# Patient Record
Sex: Male | Born: 1960 | Hispanic: Yes | Marital: Single | Smoking: Never smoker
Health system: Southern US, Community
[De-identification: ages and names within clinical notes are randomized; demographics above are authoritative.]

## PROBLEM LIST (undated history)

## (undated) DIAGNOSIS — I10 Essential (primary) hypertension: Secondary | ICD-10-CM

## (undated) DIAGNOSIS — I2510 Atherosclerotic heart disease of native coronary artery without angina pectoris: Secondary | ICD-10-CM

## (undated) HISTORY — PX: CORONARY ANGIOPLASTY WITH STENT PLACEMENT: SHX49

## (undated) HISTORY — PX: CARDIAC CATHETERIZATION: SHX172

## (undated) HISTORY — DX: Essential (primary) hypertension: I10

## (undated) HISTORY — DX: Atherosclerotic heart disease of native coronary artery without angina pectoris: I25.10

---

## 2022-06-03 IMAGING — MR OMBRO^DIREITO
5 of 7 series · 36 of 40 positions shown · non-contrast
Comparison: none

[Series 3: axial dp fat · axial · right · 3.0mm · 0.78mm/px · z∈[-68,+6]mm · 8 of 24 slices shown]
[im 1/24]
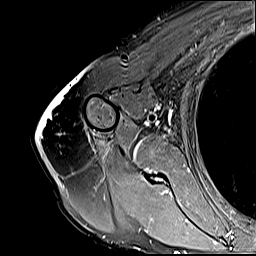
[im 4/24]
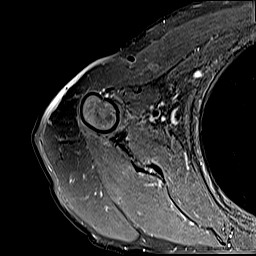
[im 7/24]
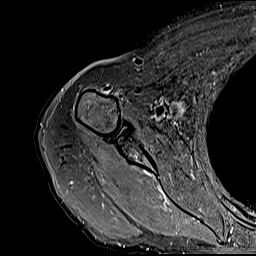
[im 10/24]
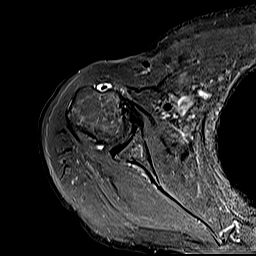
[im 14/24]
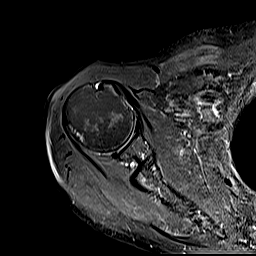
[im 17/24]
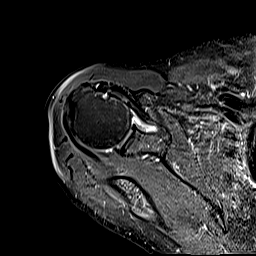
[im 20/24]
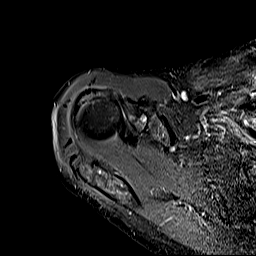
[im 24/24]
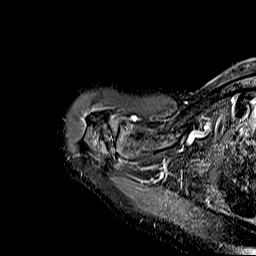

[Series 4: sag dp fat · oblique · right · 3.5mm · 0.78mm/px · 7 of 20 slices shown]
[im 1/20]
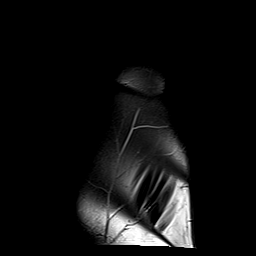
[im 4/20]
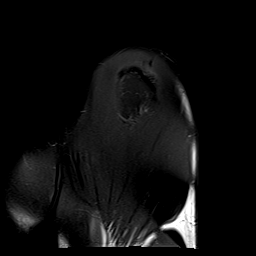
[im 7/20]
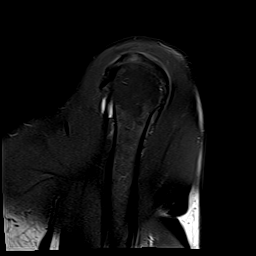
[im 10/20]
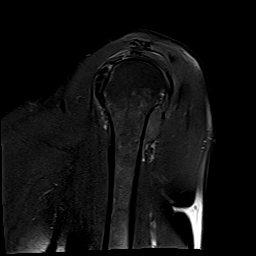
[im 13/20]
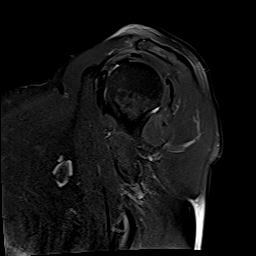
[im 16/20]
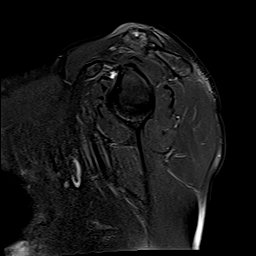
[im 20/20]
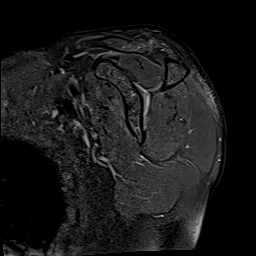

[Series 5: T1 · oblique · right · 3.5mm · 0.78mm/px · 7 of 20 slices shown (1 of 2)]
[im 1/20]
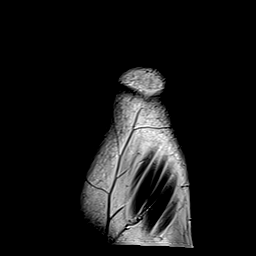
[im 4/20]
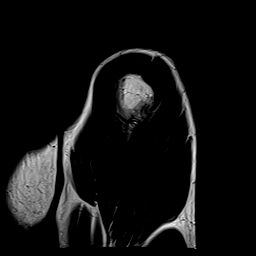
[im 7/20]
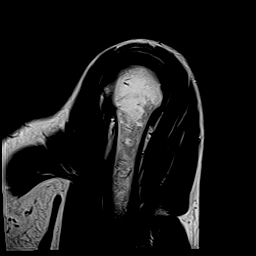
[im 10/20]
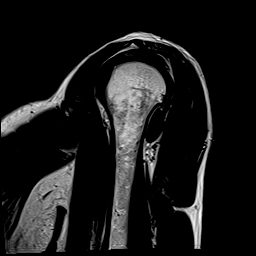
[im 13/20]
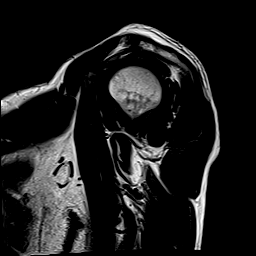
[im 16/20]
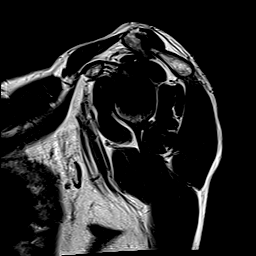
[im 20/20]
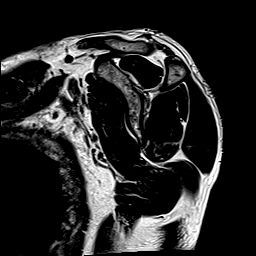

[Series 6: cor dp fat · oblique · right · 3.5mm · 0.78mm/px · 7 of 20 slices shown]
[im 1/20]
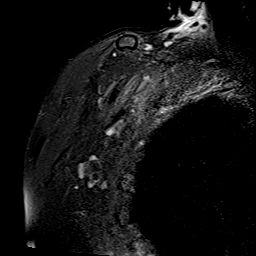
[im 4/20]
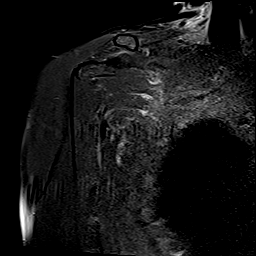
[im 7/20]
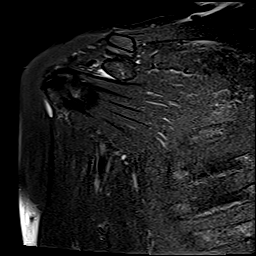
[im 10/20]
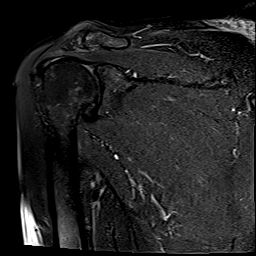
[im 13/20]
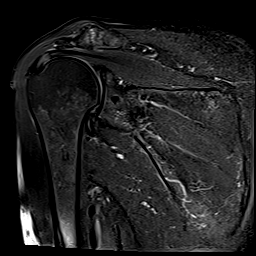
[im 16/20]
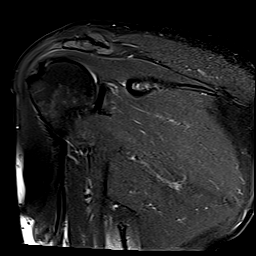
[im 20/20]
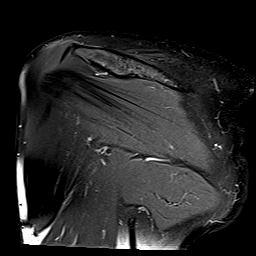

[Series 7: T1 · oblique · right · 3.5mm · 0.78mm/px · 7 of 20 slices shown (2 of 2)]
[im 1/20]
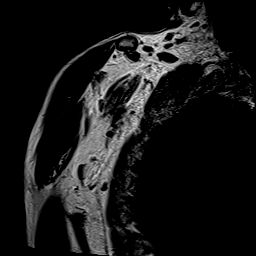
[im 4/20]
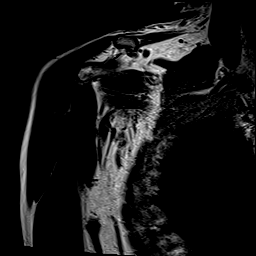
[im 7/20]
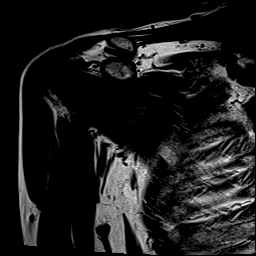
[im 10/20]
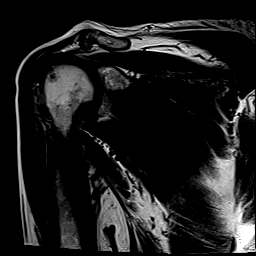
[im 13/20]
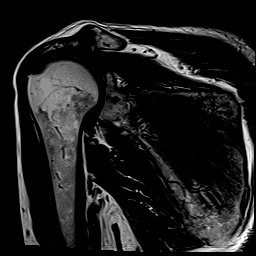
[im 16/20]
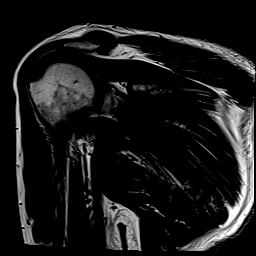
[im 20/20]
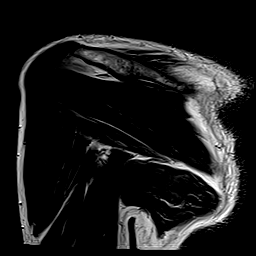

[36 of 40 positions shown; findings below may reference images not displayed]

Ressonância Magnética do Ombro Direito
Técnica:
Exame realizado em equipamento de ressonância magnética com sequências, ponderações e planos
específicos para o segmento de interesse, sem administração endovenosa do meio de contraste.
Descrição:
Tendinopatia do supra e do infraespinhal, bem como do subescapular, sem evidência de roturas.
Tendões redondo menor e cabo longo do bíceps sem anormalidades.
Ventres musculares tróficos.
Artropatia degenerativa acromioclavicular, com redução da interlinha articular e osteófitos marginais.
Acrômio curvilíneo, com inclinação lateral inferior, associada a espessamento do ligamento
coracoacromial junto à sua entese subacromial, reduzindo o túnel miotendíneo do supraespinhal.
Demais estruturas ósseas com morfologia e intensidade de sinal normais.
Discreta bursite subacromial-subdeltoidea.
Superfície condral glenoumeral preservada.
Lábio glenoidal sem alterações.
Ausência de derrame articular.
Impressão diagnóstica:
- Tendinopatia do supra e do infraespinhal, bem como do subescapular, sem evidência de roturas.
- Discreta bursite subacromial-subdeltoidea.
- Artropatia degenerativa acromioclavicular.
- Inclinação lateral inferior do acrômio, associada a espessamento do ligamento coracoacromial junto à
sua entese subacromial, reduzindo o túnel miotendíneo do supraespinhal.

## 2022-06-03 IMAGING — MR OMBRO^ESQUERDO
5 of 7 series · 36 of 40 positions shown · non-contrast
Comparison: none

[Series 3: axial dp fat · axial · left · 3.5mm · 0.78mm/px · z∈[-89,-1]mm · 8 of 24 slices shown]
[im 1/24]
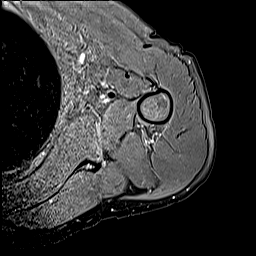
[im 4/24]
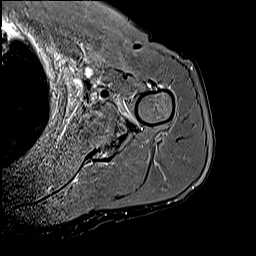
[im 7/24]
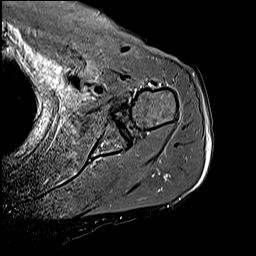
[im 10/24]
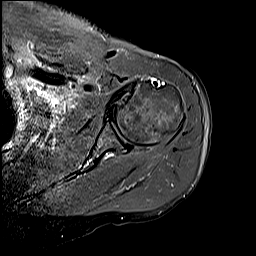
[im 14/24]
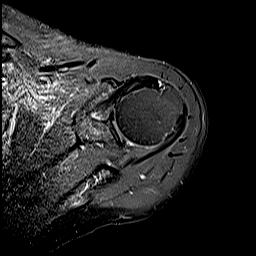
[im 17/24]
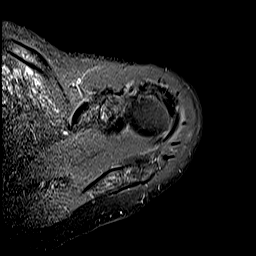
[im 20/24]
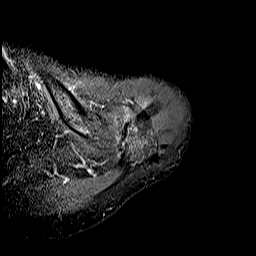
[im 24/24]
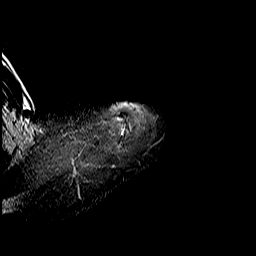

[Series 4: sag dp fat · oblique · left · 3.5mm · 0.78mm/px · 7 of 20 slices shown]
[im 1/20]
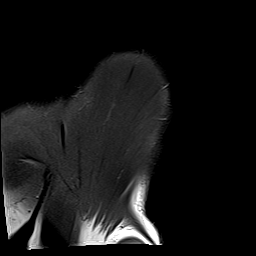
[im 4/20]
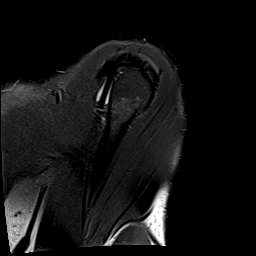
[im 7/20]
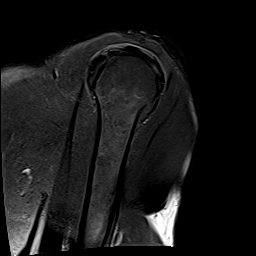
[im 10/20]
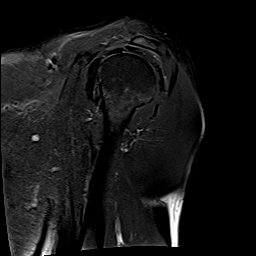
[im 13/20]
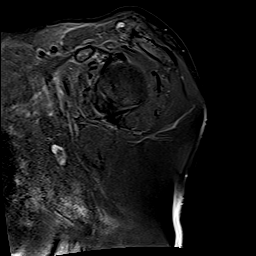
[im 16/20]
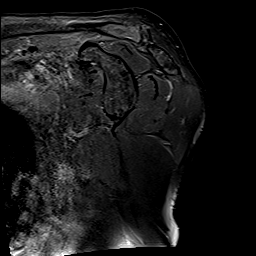
[im 20/20]
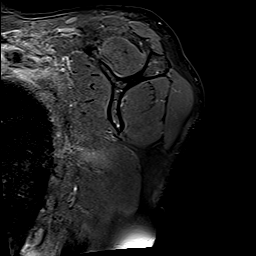

[Series 5: T1 · oblique · left · 3.5mm · 0.78mm/px · 7 of 20 slices shown (1 of 2)]
[im 1/20]
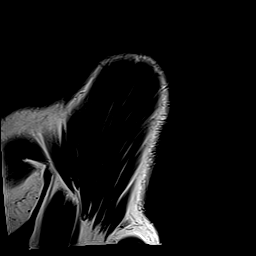
[im 4/20]
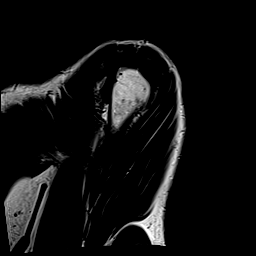
[im 7/20]
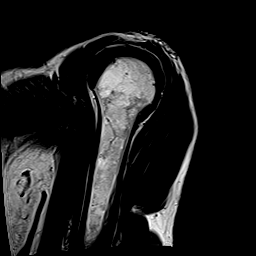
[im 10/20]
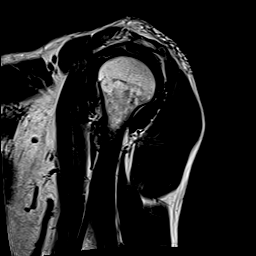
[im 13/20]
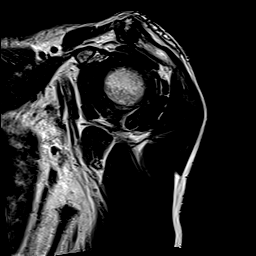
[im 16/20]
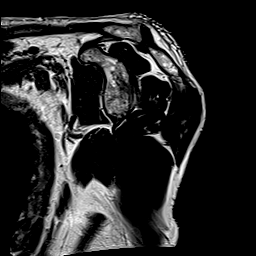
[im 20/20]
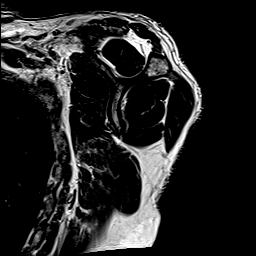

[Series 6: cor dp fat · oblique · left · 3.5mm · 0.78mm/px · 7 of 20 slices shown]
[im 1/20]
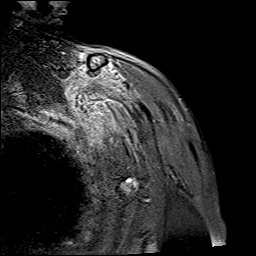
[im 4/20]
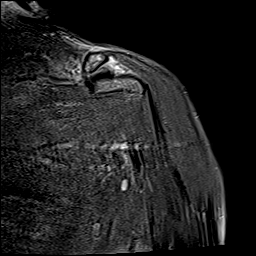
[im 7/20]
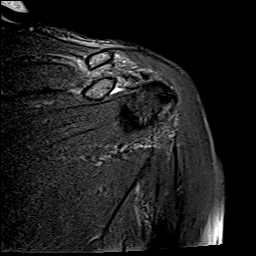
[im 10/20]
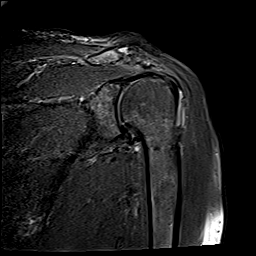
[im 13/20]
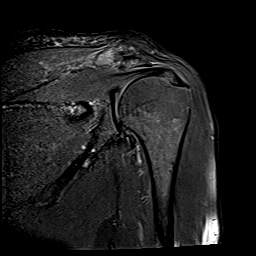
[im 16/20]
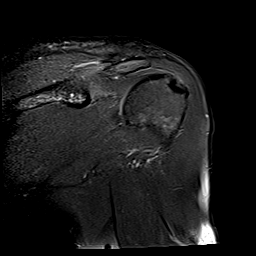
[im 20/20]
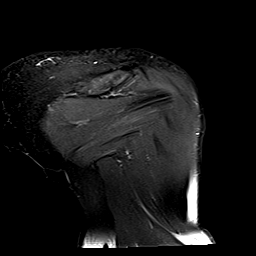

[Series 7: T1 · oblique · left · 3.5mm · 0.78mm/px · 7 of 20 slices shown (2 of 2)]
[im 1/20]
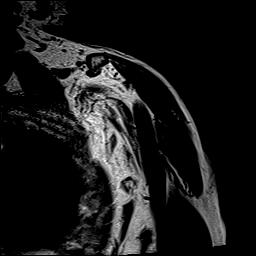
[im 4/20]
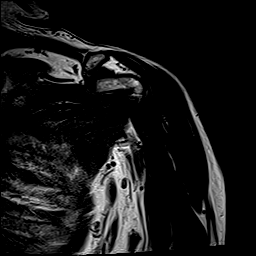
[im 7/20]
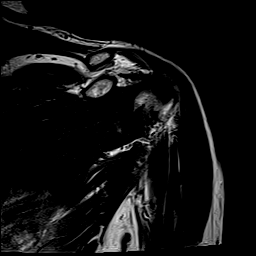
[im 10/20]
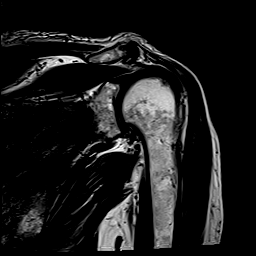
[im 13/20]
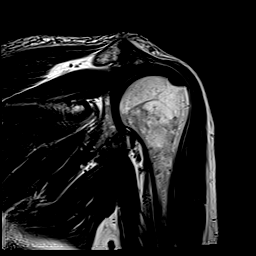
[im 16/20]
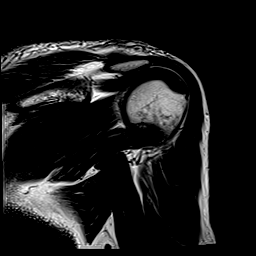
[im 20/20]
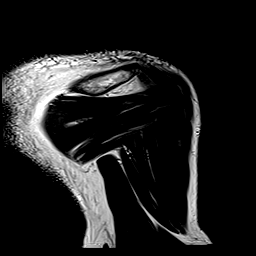

[36 of 40 positions shown; findings below may reference images not displayed]

Ressonância Magnética do Ombro Esquerdo
Técnica:
Exame realizado em equipamento de ressonância magnética com sequências, ponderações e planos
específicos para o segmento de interesse, sem administração endovenosa do meio de contraste.
Descrição:
Tendinopatia do supra e do infraespinhal, bem como do subescapular, sem evidência de roturas.
Tendões redondo menor e cabo longo do bíceps sem anormalidades.
Ventres musculares tróficos.
Artropatia degenerativa acromioclavicular, com redução da interlinha articular e osteófitos marginais.
Acrômio curvilíneo, com inclinação lateral neutra.
Demais estruturas ósseas com morfologia e intensidade de sinal normais.
Bursa subacromial-subdeltoidea de espessura habitual.
Superfície condral glenoumeral preservada.
Lábio glenoidal sem alterações.
Ausência de derrame articular.
Impressão diagnóstica:
- Tendinopatia do supra e do infraespinhal, bem como do subescapular, sem evidência de roturas.
- Artropatia degenerativa acromioclavicular, com redução da interlinha articular e osteófitos marginais.

## 2022-06-03 IMAGING — MR COLUNA^LOMBAR
4 of 7 series · 19 of 48 positions shown · non-contrast
Comparison: none

[Series 2: sagital_t2_quiet · sagittal · 4.0mm · 0.55mm/px · 6 of 12 slices shown]
[im 1/12]
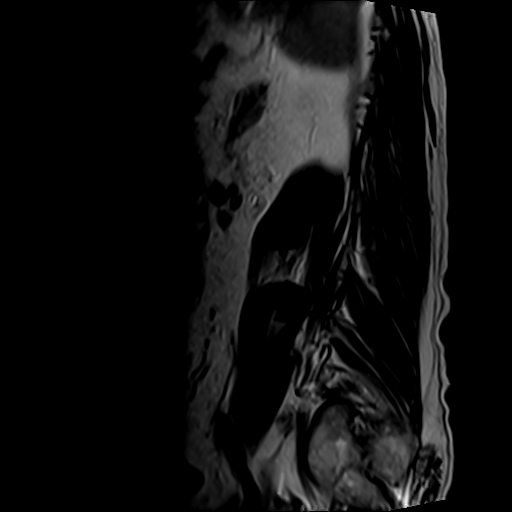
[im 3/12]
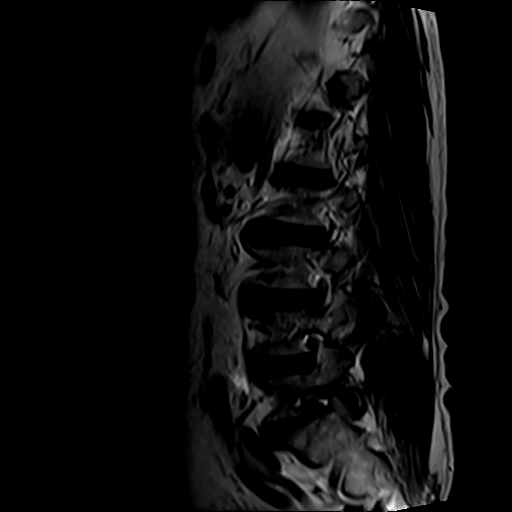
[im 5/12]
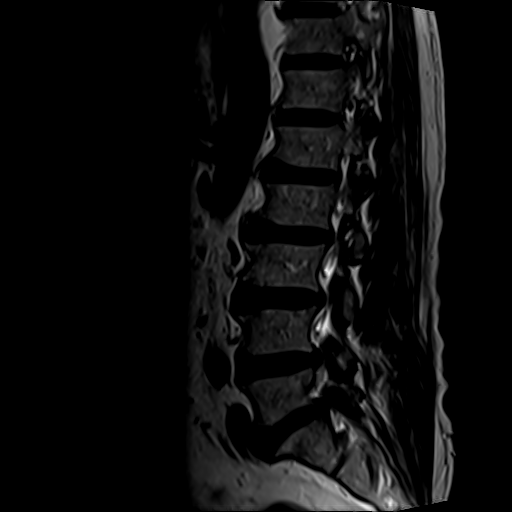
[im 7/12]
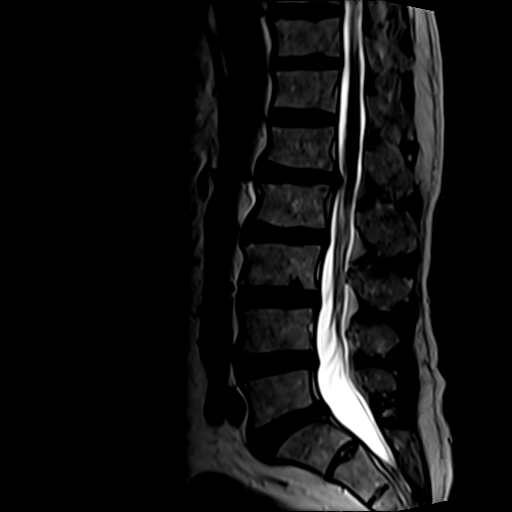
[im 9/12]
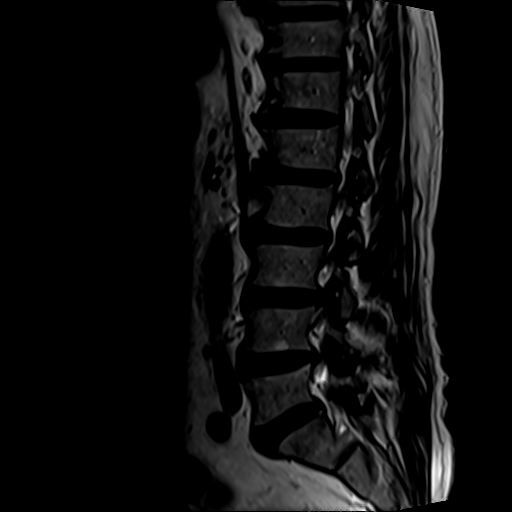
[im 12/12]
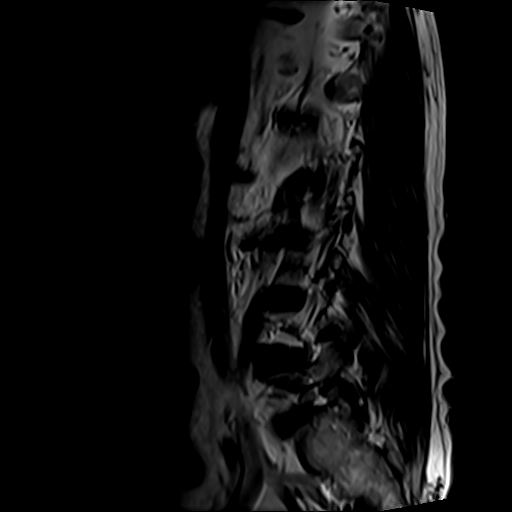

[Series 3: sagital_t1_quiet · sagittal · 4.0mm · 0.55mm/px · 5 of 12 slices shown]
[im 1/12]
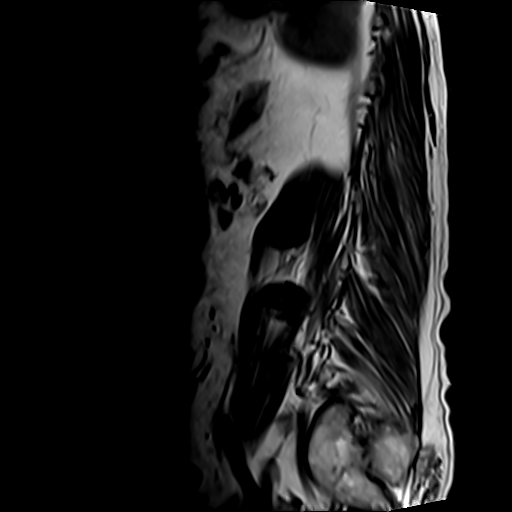
[im 3/12]
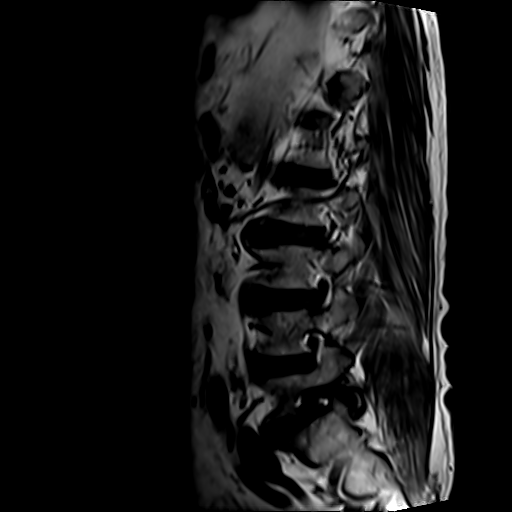
[im 6/12]
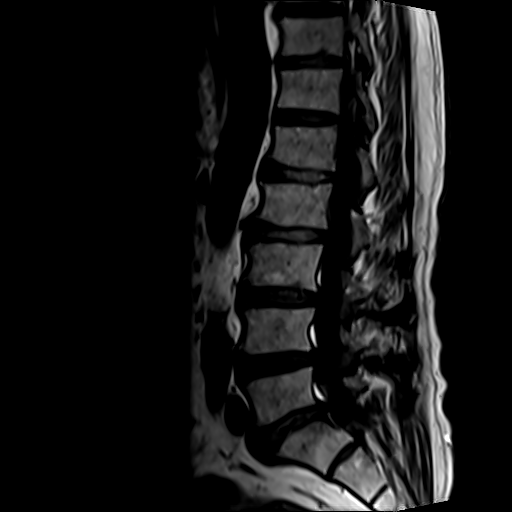
[im 9/12]
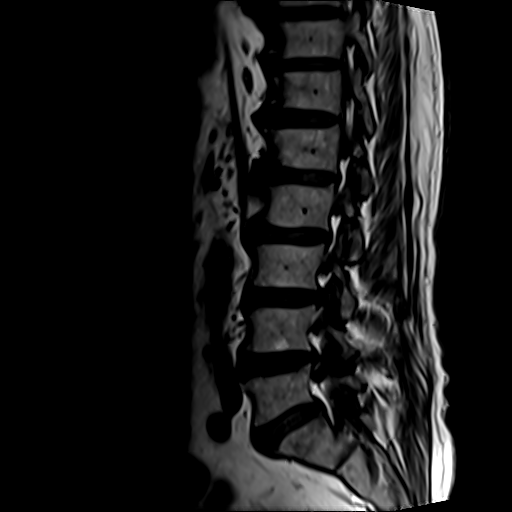
[im 12/12]
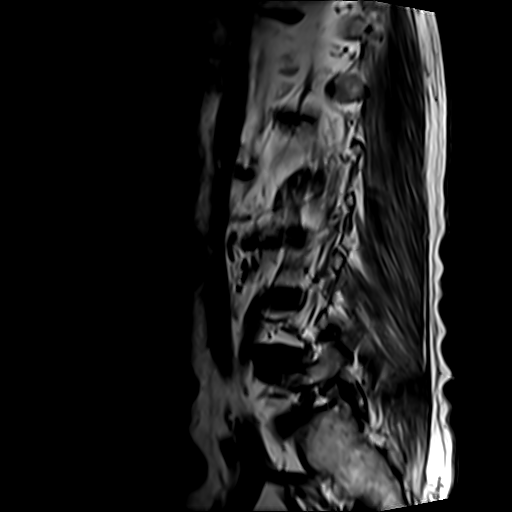

[Series 4: axial_t2_quiet · axial · 4.0mm · 0.43mm/px · z∈[-28,+45]mm · 5 of 20 slices shown (1 of 2)]
[im 1/20]
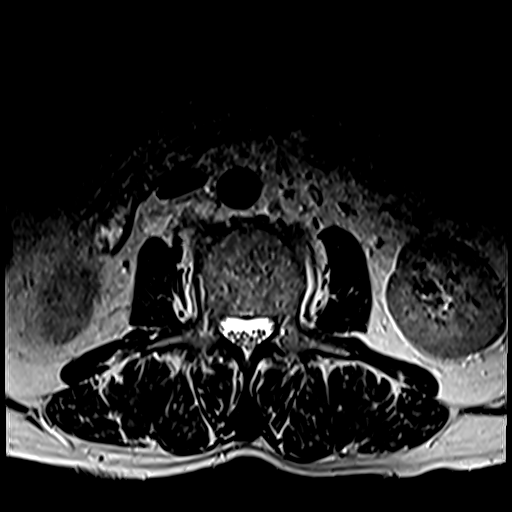
[im 3/20]
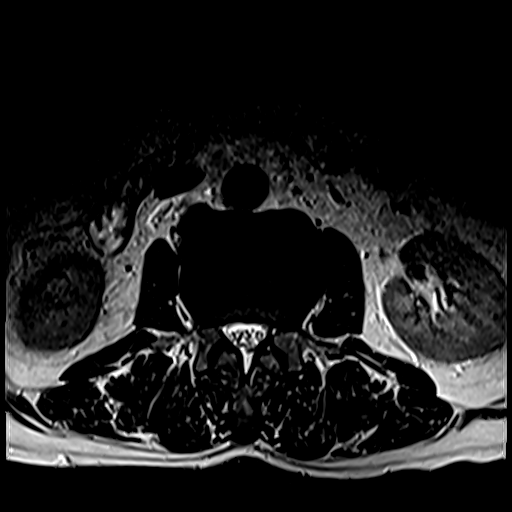
[im 5/20]
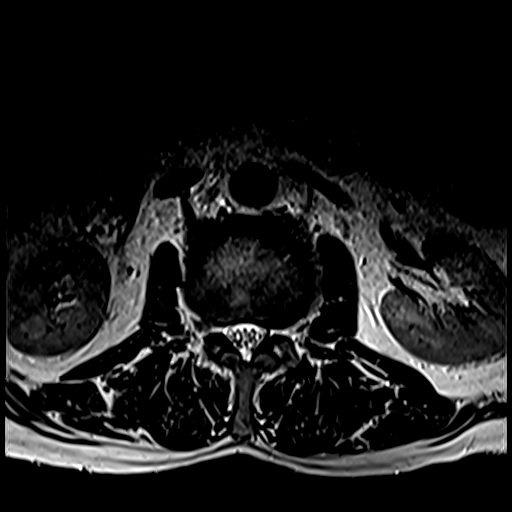
[im 10/20]
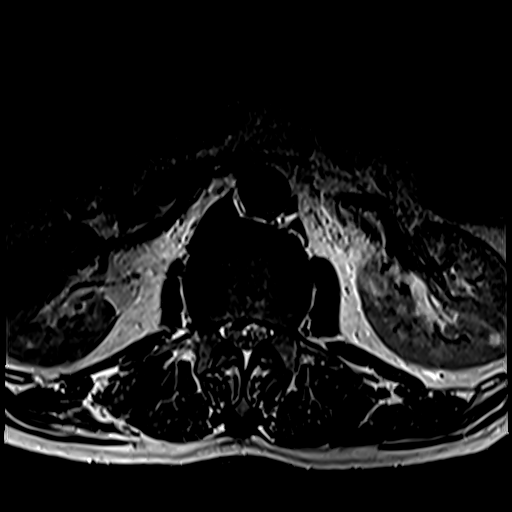
[im 17/20]
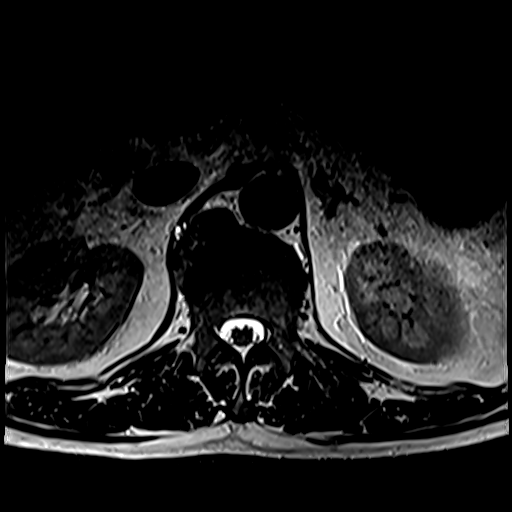

[Series 5: axial_t2_quiet · axial · 4.0mm · 0.43mm/px · z∈[-161,-72]mm · 3 of 24 slices shown (2 of 2)]
[im 3/24]
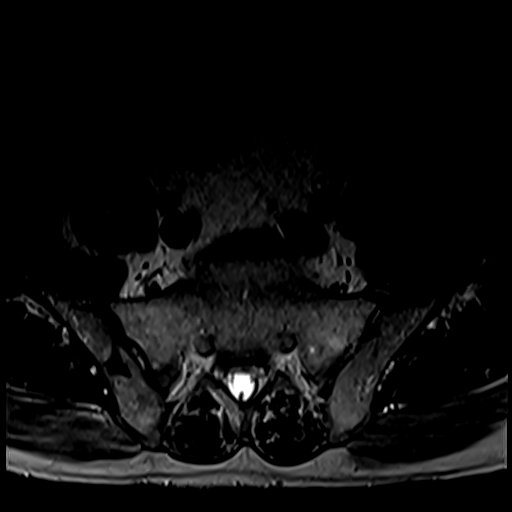
[im 12/24]
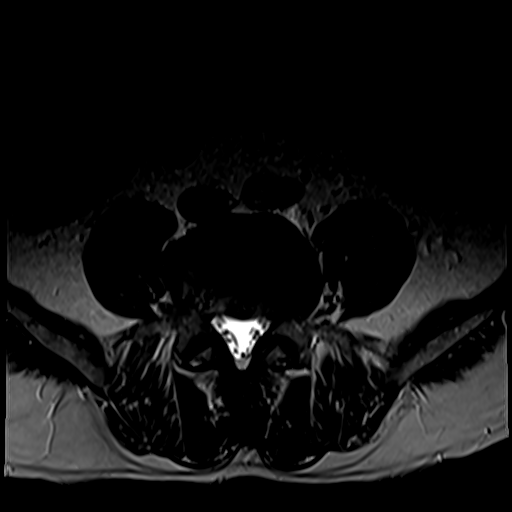
[im 21/24]
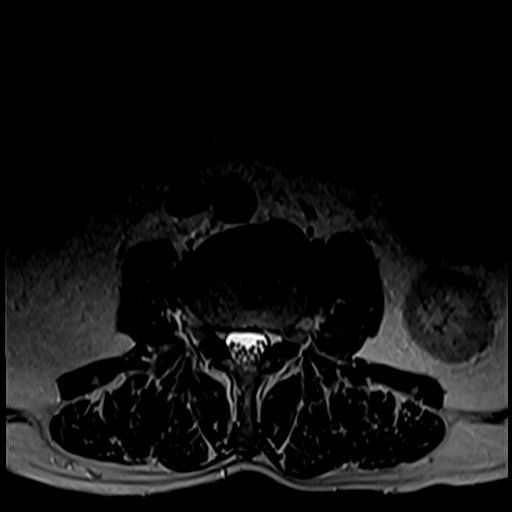

[19 of 48 positions shown; findings below may reference images not displayed]

RESSONÂNCIA MAGNÉTICA DA COLUNA LOMBAR
Técnica:
Exame realizado com imagens obtidas predominantemente em T1 e T2, em aquisições multiplanares.
Análise:
Leve retrolistese de L4-L5.
Corpos vertebrais de altura e sinal da medular óssea conservados. Reações osteofitárias marginais nos
corpos vertebrais. 
Nódulo de Schmörl no platô inferior de L3.
Redução da altura discal em L1-L2 e L2-L3.
Hipertrofia das articulações interapofisárias, mais evidente em L4-L5 e L5-S1.
Protrusão discal posterior central em L1-L2, comprimindo a face anterior do saco dural.
Abaulamento discal difuso em L2-L3, retificando a face anterior do saco dural e estendendo-se às bases
foraminais determinando leve redução da amplitude.
Abaulamento discal difuso em L3-L4 e L4-L5, retificando a face anterior do saco dural e estendendo-se
às bases foraminais, determinando moderada redução da amplitude e tocando as raízes emergentes no
segmento extra canal.
Protrusão discal posterior central em L5-S1, obliterando a gordura epidural anterior e tocando o saco
dural.
Diâmetros normais do canal vertebral e demais forames de conjugação.
Cone medular de forma e sinal normais na transição tóraco-lombar.
Raízes da cauda equina de distribuição anatômica no interior do saco dural.
Musculatura paravertebral sem alterações.
Edema dos ligamentos interespinhosos em L3-L4 e L5-S1 denotando sobrecarga mecânica.

## 2022-09-22 ENCOUNTER — Ambulatory Visit (INDEPENDENT_AMBULATORY_CARE_PROVIDER_SITE_OTHER): Payer: HMO | Admitting: Cardiology

## 2022-09-22 ENCOUNTER — Encounter (INDEPENDENT_AMBULATORY_CARE_PROVIDER_SITE_OTHER): Payer: Self-pay | Admitting: Cardiology

## 2022-09-22 VITALS — BP 104/78 | HR 64 | Ht 72.0 in | Wt 192.0 lb

## 2022-09-22 DIAGNOSIS — R0989 Other specified symptoms and signs involving the circulatory and respiratory systems: Secondary | ICD-10-CM

## 2022-09-22 DIAGNOSIS — E785 Hyperlipidemia, unspecified: Secondary | ICD-10-CM

## 2022-09-22 DIAGNOSIS — I214 Non-ST elevation (NSTEMI) myocardial infarction: Secondary | ICD-10-CM

## 2022-09-22 DIAGNOSIS — I2510 Atherosclerotic heart disease of native coronary artery without angina pectoris: Secondary | ICD-10-CM

## 2022-09-22 DIAGNOSIS — I10 Essential (primary) hypertension: Secondary | ICD-10-CM

## 2022-09-22 NOTE — Progress Notes (Signed)
[facility] OFFICE CONSULTATION NOTE    [facility] [place] [facility] OFFICE - [place] [id number]  [place] [address]  Dept: [phone]  Dept Fax: [phone]         Patient Name: Alexander Russell    Date of Visit:  September 22, 2022  Date of Birth: 1961/06/07  AGE: 62 y.o.  Medical Record #: [id number]  Requesting Physician: No primary care provider on file.      CHIEF COMPLAINT:  Coronary artery disease    HISTORY OF PRESENT ILLNESS    Alexander Russell is being seen today for cardiovascular evaluation at the request of No primary care provider on file.Marland Kitchen He is a pleasant 62 y.o. male who presents for coronary artery disease.    Patient was hospitalized at [facility] last week for NSTEMI.  He presented with chest pain and T wave abnormalities, and ultimately underwent LHC, which revealed a large thrombotic subtotal occlusion of the proximal ectatic RCA, for which he underwent complicated intervention with thrombectomy and DES.  His echocardiogram was largely unremarkable, and he was discharged uneventfully on 1/27.    Since discharge, patient has been feeling well.  He has not had any discomfort in his chest, nor shortness of breath.  He denied numbness or tingling in his right hand.  He has had some bilateral upper extremity aching that is nonexertional.  He has also had lightheadedness, particularly when he first gets out of bed in the morning.  This resulted in a fall once, without syncope.  Blood pressure today in the office is 104/78.      PAST MEDICAL HISTORY: He has a past medical history of Coronary artery disease and Hypertension. He has a past surgical history that includes Cardiac catheterization and Coronary angioplasty with stent.    ALLERGIES: No Known Allergies    MEDICATIONS:   Patient's current medications were reviewed. ONLY Cardiac medications were updated unless others were addressed in assessment and plan.    Current Outpatient  Medications:     Aspirin Low Dose 81 MG EC tablet, Take 1 tablet (81 mg) by mouth every morning, Disp: , Rfl:     atorvastatin (LIPITOR) 40 MG tablet, Take 1 tablet (40 mg) by mouth daily, Disp: , Rfl:     clopidogrel (PLAVIX) 75 mg tablet, Take 1 tablet (75 mg) by mouth every morning, Disp: , Rfl:     metoprolol tartrate (LOPRESSOR) 25 MG tablet, Take 1 tablet (25 mg) by mouth 2 (two) times daily, Disp: , Rfl:      FAMILY HISTORY: family history includes Hypertension in his brother.    SOCIAL HISTORY: He reports that he has never smoked. He has never used smokeless tobacco. He reports that he does not drink alcohol and does not use drugs.    REVIEW OF SYSTEMS:   No data recorded       PHYSICAL EXAMINATION    Visit Vitals  BP 104/78 (BP Site: Left arm, Patient Position: Sitting, Cuff Size: Medium)   Pulse 64   Ht 1.829 m (6')   Wt 87.1 kg (192 lb)   BMI 26.04 kg/m        General Appearance:  Well appearing, no acute distress  Skin: Warm and dry  Head: Normocephalic, atraumatic  Eyes: Conjunctivae and lids unremarkable.  Neck: No carotid bruit  Chest: Clear to auscultation bilaterally with good air movement and respiratory effort and no wheezes, rales, or rhonchi  Cardiovascular: Regular rhythm, S1 normal, S2 normal, No S3 or S4.  No murmur. No gallops or rubs detected   Extremities: Warm without edema. 1+ right radial pulse, 2+ left radial pulse  Neuro: No gross motor or sensory deficits noted.      ECG (09/15/22): Sinus arrhythmia, LAD      LABS:   No results found for: "WBC", "HGB", "HCT", "PLT"  No results found for: "GLU", "BUN", "CREAT", "NA", "K", "CL", "CO2", "AST", "ALT"  No results found for: "MG", "TSH", "HGBA1C", "BNP"  No results found for: "CHOL", "TRIG", "HDL", "LDL"      IMPRESSION:   Alexander Russell is a 62 y.o. male with the following problems:    Coronary artery disease  NSTEMI 09/15/22 s/p thrombectomy/DES to thrombotic proximal ectatic RCA.  Minimal residual disease.  Orthostasis  Diminished right  radial pulse  Hypertension  Hyperlipidemia.  LDL 100 mg/dL Jan 2024.  Echo Jan 2024: LVEF 60-65%, mild LVH, G1DD, mild MAC, trace MR      RECOMMENDATIONS:    Continue aspirin indefinitely, clopidogrel through at least January 2025.  Continue statin therapy.  Recheck lipids and check lipoprotein a in 3 months.  Target LDL less than 70 mg/dL.  Continue metoprolol.  Will discontinue lisinopril in light of orthostasis.  Right upper extremity ultrasound for diminished right radial pulse.  Hand is otherwise neurovascularly intact.  Patient will contact the office if he has ongoing bilateral upper extremity discomfort over next several weeks, which could reflect myalgias from statin therapy necessitating decrease in dose.  Cardiac rehab referral  APP follow-up 4 months, MD 1 year.                                                 Orders Placed This Encounter   Procedures    Upper Extremity Arterial Duplex Limited(Right)    Lipid panel    Lipoprotein A (LPA)    Referral to Cardiac Rehabilitation    APP Office Visit (HRT )       No orders of the defined types were placed in this encounter.        SIGNED:    Edman Circle, MD         This note was generated by the Dragon speech recognition and may contain errors or omissions not intended by the user. Grammatical errors, random word insertions, deletions, pronoun errors, and incomplete sentences are occasional consequences of this technology due to software limitations. Not all errors are caught or corrected. If there are questions or concerns about the content of this note or information contained within the body of this dictation, they should be addressed directly with the author for clarification.

## 2022-09-24 ENCOUNTER — Encounter (INDEPENDENT_AMBULATORY_CARE_PROVIDER_SITE_OTHER): Payer: Self-pay

## 2022-10-11 ENCOUNTER — Telehealth (INDEPENDENT_AMBULATORY_CARE_PROVIDER_SITE_OTHER): Payer: Self-pay

## 2022-10-11 NOTE — Telephone Encounter (Signed)
Assisted by [facility] # [phone]     Patient was referred for cardiac rehab by Dr Melina Copa.  Patient states he was told he needs to make an appt.  He does not understand what this is for.  Explained the process of setting up cardiac rehab and that the 1st visit is an intake appt.  Gave him the phone number for [facility] rehab department.  No other questions.

## 2022-10-27 ENCOUNTER — Other Ambulatory Visit (INDEPENDENT_AMBULATORY_CARE_PROVIDER_SITE_OTHER): Payer: HMO

## 2022-11-01 DIAGNOSIS — I214 Non-ST elevation (NSTEMI) myocardial infarction: Secondary | ICD-10-CM | POA: Insufficient documentation

## 2022-11-01 NOTE — Initial Cardiac Rehab ITP (Signed)
Cardiac Rehab Individual Treatment Plan    Referring Provider: Edman Circle, MD   Comments: Initial Assessment    Assessment Period  Phase: Initial Assessment  Program : Currently enrolled in Phase 2  Session #: 1  First Exercise Session (Date): 11/02/22  Referring Diagnosis: NSTEMI, PCI, Stent (stent RCA)  Date of Event: 09/15/22  Additional Cardiac History: None  AACVPR Risk Stratification: Low risk  Ejection Fraction: 60-65%  Ejection Fraction (Test): Echo  Ejection Fraction Study Date: 08/2022    Exercise  Assessment: No exercise history     Exercise Prescription/Plan  40% Heart Rate Reserve: 118  80% Heart Rate Reserve: 145  Frequency: Cardiac Rehab 3 days/week, Home 2 days/week  RPE Range: 11-14  METS: 2-4  Time: 20-29 minutes  Mode: Treadmill, Bike, Walking  Strength Training: Begin when cleared by CR clinician  Education/Intervention: Educate on RPE, THR, warm up/cool down, Progress time and intensity when a steady state of HR and RPE occur, Educate on signs/symptoms of cardiovascular compromise  Goals: Increase in METS by at least 40%, Cardiovascular exercise 150-300 minutes per week, Avoid inactivity  Goal(s) Status: Not applicable on initial assessment     Nutrition  Assessment: Pending  Height: 182.9 cm (6')  Initial Weight (lbs): 192lb  Weight (lbs): 192lb  BMI (calculated): 26  BMI Classification: Overweight BMI 25-29.9     Nutrition Plan  Current Eating Plan: Unspecified nutrition plan  Food Log: Provided  Hydration: Water  Fluid Restriction: No fluid restriction  Consult Status: Consult not yet scheduled  CRP Education/Intervention: Reinforce RDN recommendations, Educate on the health benefits of weight management, Educate patient on heart healthy food choices, Hydration Guidelines  Goals: BMI 18.5-24.9, Waist (men) <40 inches, (women) <35 inches, Heart healthy eating, Decrease body weight  Goal(s) Status: Not applicable on initial assessment     Psychosocial  Assessment: Pending assessment  Are  you married, widowed, divorced, separated, never married, or living with a partner?:  (single)  Barriers to Learning: Language (needs interpreter)  Initial PHQ-9 Score:  (pending)  Initial COOP Quality of Life Survey Score: pending  Education/Intervention: Regular physical activity/exercise, Assist patient with identifying stressors, Educate on positive coping mechanisms  Goals: Adequate support system, Positive coping mechanisms  Goal(s) Status: Not applicable on initial assessment     Blood Pressure  Assessment: No Dx HTN  Resting BP Range: 104/78 (OV)  Education/Intervention: Obtain home BP monitor, Recommend home BP checks, Educate/Reinforce benefits of regular exercise on BP  Goals: BP<120/80 or within MD recommended guidelines, Medication compliance, Monitor BP at home  Goal(s) Status: Not applicable on initial assessment     Heart Failure:  Assessment: No Heart Failure    SET-PAD:  Assessment: No SET PAD    Patient Stated Goals  What Matters Most?: pending (11/01/2022 10:00 AM)      Patient Stated Goal 1: pending

## 2022-11-02 ENCOUNTER — Inpatient Hospital Stay
Admission: RE | Admit: 2022-11-02 | Discharge: 2022-11-02 | Disposition: A | Discharge status: Home / Self Care | Payer: HMO | Source: Ambulatory Visit | Attending: Cardiology | Admitting: Cardiology

## 2022-11-02 ENCOUNTER — Other Ambulatory Visit (INDEPENDENT_AMBULATORY_CARE_PROVIDER_SITE_OTHER): Payer: Self-pay | Admitting: Cardiology

## 2022-11-02 VITALS — Wt 193.2 lb

## 2022-11-02 DIAGNOSIS — I214 Non-ST elevation (NSTEMI) myocardial infarction: Secondary | ICD-10-CM | POA: Insufficient documentation

## 2022-11-02 NOTE — Progress Notes (Signed)
Alexander Russell presented today for initial assessment.  Patient care completed was >61 minutes.  The following was performed during the initial assessment:    Evaluation of ECG using [facility] telemetry monitoring? yes  Physical assessment  Exercise assessment, planning, and education  Risk factor assessment, planning, and education  ITP review and update  Outcomes data collected and reviewed      Physical Assessment     BP: 133/86    SPO2: 98    HR: 70     Have you had any lightheadedness or dizziness?No    Apical Pulse:  regular    Do you have any sores, numbness or tingling in your lower extremities? no    Heart Sounds: S1 and S2     Peripheral Edema: none    Diabetic Foot Check:  Do you have any redness, cuts or sores on your feet? n/a    Lung Sounds:  CTAB      Have you had any increase or changes in shortness of breath? No    Muscle Skeletal: no deficit    Incisions: none    Have you had any new / other pain? No    Functional Mobility:  Timed Up and Go: 6.9    Comments: no

## 2022-11-03 ENCOUNTER — Telehealth (INDEPENDENT_AMBULATORY_CARE_PROVIDER_SITE_OTHER): Payer: Self-pay

## 2022-11-03 LAB — LIPID PANEL
Cholesterol / HDL Ratio: 2.6 ratio (ref 0.0–5.0)
Cholesterol: 95 mg/dL — ABNORMAL LOW (ref 100–199)
HDL: 37 mg/dL — ABNORMAL LOW (ref >39)
LDL Chol Calculated (NIH): 46 mg/dL (ref 0–99)
Triglycerides: 50 mg/dL (ref 0–149)
VLDL Calculated: 12 mg/dL (ref 5–40)

## 2022-11-03 LAB — LIPOPROTEIN A (LPA): Lipoprotein (a): 131.4 nmol/L — ABNORMAL HIGH (ref <75.0)

## 2022-11-03 NOTE — Telephone Encounter (Addendum)
Spoke with pt and he expressed understanding of the results.    ----- Message from Edman Circle, MD sent at 11/03/2022 12:13 PM EDT -----  Regarding: Lipids  Can you please let this pt know that his lipids are now at goal, which is great.  He does have an elevated Lp(a), which likely accounts for his significant CAD at a young age.  This requires no additional treatment at present, and we can discuss with him whether he would be interested in research trials for Lp(a) when we next see him in the office.  Thanks

## 2022-11-08 ENCOUNTER — Inpatient Hospital Stay
Admission: RE | Admit: 2022-11-08 | Discharge: 2022-11-08 | Disposition: A | Discharge status: Home / Self Care | Payer: HMO | Source: Ambulatory Visit

## 2022-11-08 VITALS — Wt 192.8 lb

## 2022-11-08 DIAGNOSIS — I214 Non-ST elevation (NSTEMI) myocardial infarction: Secondary | ICD-10-CM

## 2022-11-10 ENCOUNTER — Inpatient Hospital Stay
Admission: RE | Admit: 2022-11-10 | Discharge: 2022-11-10 | Disposition: A | Discharge status: Home / Self Care | Payer: HMO | Source: Ambulatory Visit

## 2022-11-10 DIAGNOSIS — I214 Non-ST elevation (NSTEMI) myocardial infarction: Secondary | ICD-10-CM

## 2022-11-10 NOTE — Addendum Note (Signed)
Encounter addended by: Rubye Oaks on: 11/10/2022 4:47 PM   Actions taken: Flowsheet accepted

## 2022-11-11 ENCOUNTER — Inpatient Hospital Stay
Admission: RE | Admit: 2022-11-11 | Discharge: 2022-11-11 | Disposition: A | Discharge status: Home / Self Care | Payer: HMO | Source: Ambulatory Visit

## 2022-11-11 DIAGNOSIS — I214 Non-ST elevation (NSTEMI) myocardial infarction: Secondary | ICD-10-CM

## 2022-11-15 ENCOUNTER — Inpatient Hospital Stay
Admission: RE | Admit: 2022-11-15 | Discharge: 2022-11-15 | Disposition: A | Discharge status: Home / Self Care | Payer: HMO | Source: Ambulatory Visit

## 2022-11-15 DIAGNOSIS — I214 Non-ST elevation (NSTEMI) myocardial infarction: Secondary | ICD-10-CM

## 2022-11-17 ENCOUNTER — Inpatient Hospital Stay
Admission: RE | Admit: 2022-11-17 | Discharge: 2022-11-17 | Disposition: A | Discharge status: Home / Self Care | Payer: HMO | Source: Ambulatory Visit

## 2022-11-17 DIAGNOSIS — I214 Non-ST elevation (NSTEMI) myocardial infarction: Secondary | ICD-10-CM

## 2022-11-18 ENCOUNTER — Inpatient Hospital Stay
Admission: RE | Admit: 2022-11-18 | Discharge: 2022-11-18 | Disposition: A | Discharge status: Home / Self Care | Payer: HMO | Source: Ambulatory Visit | Attending: Cardiology | Admitting: Cardiology

## 2022-11-18 ENCOUNTER — Inpatient Hospital Stay
Admission: RE | Admit: 2022-11-18 | Discharge: 2022-11-18 | Disposition: A | Discharge status: Home / Self Care | Payer: HMO | Source: Ambulatory Visit

## 2022-11-18 DIAGNOSIS — I214 Non-ST elevation (NSTEMI) myocardial infarction: Secondary | ICD-10-CM

## 2022-11-18 NOTE — Progress Notes (Signed)
Alexander Russell participated in a 45-minute nutrition consultation today as part of the Cardiac and/or Pulmonary Rehabilitation program. Nutrition and exercise guidelines for cardiopulmonary health were reviewed. Other topics discussed today include: Heart Healthy Nutrition Therapy (Spanish). Please see Cardiac and/or Pulmonary Rehab Individual Treatment Plan for additional details.

## 2022-11-22 ENCOUNTER — Inpatient Hospital Stay
Admission: RE | Admit: 2022-11-22 | Discharge: 2022-11-22 | Disposition: A | Discharge status: Home / Self Care | Payer: HMO | Source: Ambulatory Visit | Attending: Cardiology | Admitting: Cardiology

## 2022-11-22 DIAGNOSIS — I214 Non-ST elevation (NSTEMI) myocardial infarction: Secondary | ICD-10-CM | POA: Insufficient documentation

## 2022-11-24 ENCOUNTER — Inpatient Hospital Stay
Admission: RE | Admit: 2022-11-24 | Discharge: 2022-11-24 | Disposition: A | Discharge status: Home / Self Care | Payer: HMO | Source: Ambulatory Visit

## 2022-11-24 DIAGNOSIS — I214 Non-ST elevation (NSTEMI) myocardial infarction: Secondary | ICD-10-CM

## 2022-11-25 ENCOUNTER — Inpatient Hospital Stay
Admission: RE | Admit: 2022-11-25 | Discharge: 2022-11-25 | Disposition: A | Discharge status: Home / Self Care | Payer: HMO | Source: Ambulatory Visit

## 2022-11-25 DIAGNOSIS — I214 Non-ST elevation (NSTEMI) myocardial infarction: Secondary | ICD-10-CM

## 2022-11-29 ENCOUNTER — Inpatient Hospital Stay
Admission: RE | Admit: 2022-11-29 | Discharge: 2022-11-29 | Disposition: A | Discharge status: Home / Self Care | Payer: HMO | Source: Ambulatory Visit

## 2022-11-29 DIAGNOSIS — I214 Non-ST elevation (NSTEMI) myocardial infarction: Secondary | ICD-10-CM

## 2022-11-29 NOTE — Cardiac Rehab ITP (Signed)
Cardiac Rehab Individual Treatment Plan    Referring Provider: Verner Mould, MD   Comments: pt has been attending 3x a week    Assessment Period  Phase: 30 Day Re-assessment  Program : Currently enrolled in Phase 2  Session #: 12  First Exercise Session (Date): 11/02/22  Referring Diagnosis: NSTEMI, PCI, Stent (stent RCA)  Date of Event: 09/15/22  Additional Cardiac History: None  AACVPR Risk Stratification: Low risk  Ejection Fraction: 60-65%  Ejection Fraction (Test): Echo  Ejection Fraction Study Date: 08/2022  Comment: pt has been attending 3x a week    Exercise  Assessment: Exercise history 2 weeks pre cardiac rehab/post cardiac event  Physical Limitations: No  Current Exercise: Yes  Where: Cardiac Rehab (track)  On average, how many days per week do you engage in moderate to strenuous exercise (like a brisk walk)?: 5 days  On average, how many minutes do you engage in exercise at this level?: 40 min  Strength Training: Yes  Interval Training: Yes  Session #3 MET Level: 5.4  Current Peak MET Level: 7.1  Peak MET Goal: 7.56  MET Level Percentage Increase (%): 31.48  Adherence: Adheres to current cardiac rehab exercise prescription  Comment: started intervals on the bike and TM. No cardiac symptoms     Exercise Prescription/Plan  40% Heart Rate Reserve: 118  80% Heart Rate Reserve: 145  Frequency: Cardiac Rehab 3 days/week, Home 3 days/week  RPE Range: 12-16  METS: 7.0  Time: 40-49 minutes  Mode: Treadmill, Bike, Rower  Strength Training: 2-3 days per week, 10-15 reps in 1-3 sets to moderate fatigue, Free weights, Wall pulley, Chair exercise  Education/Intervention: Self monitoring using pulse taking, heart rate monitor, activity tracker, Progress time and intensity when a steady state of HR and RPE occur  Goals: Increase in METS by at least 40%, Cardiovascular exercise 150-300 minutes per week, Avoid inactivity  Goal(s) Status: Goal partially met  Comment: planning to increase patients intensity on the upright  bike. goal is to return to running     Nutrition  Assessment: Completed  Height: 182.9 cm (6')  Initial Weight (lbs): 193.2  Weight (lbs): 192  BMI (calculated): 26  BMI Classification: Overweight BMI 25-29.9  Waist Circumference (inches): 40.25  Initial Nutrition Assessment Survey Score : 15  Initial Nutrition Assessment Survey Score Indicates: 14-20: Demonstrates nutrition strategies to support reduction in CV risk, may meet with RDN for MNT reinforcement     Nutrition Plan  Current Eating Plan: Heart healthy  Food Log: Declined (gave 24-hr recall)  Hydration: Water  Fluid Restriction: No fluid restriction  Dietitian Consult Date: 11/18/22  Consult Status: Completed  CRP Education/Intervention: Educate patient on heart healthy food choices, Educate on the health benefits of weight management, Reinforce RDN recommendations  Goals: BMI 18.5-24.9, Waist (men) <40 inches, (women) <35 inches, Heart healthy eating, Decrease body weight  Goal(s) Status: Goal partially met  Comment: patient stated his nurtition has improved- no salt or sugar but is concerned he's not losing weight. discussed watching portion control  Nutrition Assessment Indicates: the following diagnoses:  Diagnosis 1 - Intake: Adequate energy intake  Diagnosis 3 - Behavioral: Food and nutrition-related knowledge deficit  Nutrition Intervention: Continue with current plan  Nutrition Monitoring /Evaluation: Follow up with RDN as needed; RDN contact info provided  Goal 1: Continue heart healthy diet  Goal 1 Status: Goal partially met (see comments)  Comment: Action plan: Continue to limit foods that are high in saturated fat such as  fried foods, butter, cream, coconut oil, coconut milk, ice cream, chips, chicken with skin, lamb, and high-fat cuts of meat such as ribs, bacon, sausage, hot dogs, salami, brisket, ribeye, and [place] strip. Choose a variety of heart-healthy proteins such as seafood, skinless poultry, eggs (2-4 per week), beans, peas, lentils,  nuts, seeds, tofu, low fat dairy products, and lean cuts of meat such as sirloin, tenderloin, and 90% lean ground meat or greater. Choose foods that are grilled, baked, broiled, roasted, braised, steamed, or poached instead of deep-fried. Eat more unsaturated and omega-3 fats from salmon, tuna, cod, trout, nuts, seeds (like chia and flax), nut butters, avocados, and liquid vegetable oils such as olive or canola oil. Continue to emphasize complex carbohydrates including oats, barley, quinoa, brown or wild rice, 100% whole wheat bread, whole wheat pasta, whole fruit, and starchy vegetables instead of white bread, white rice, and sugary foods and beverages. Increase fiber intake from vegetables, fruits, whole grains, beans, nuts, and seeds. Follow up with dietitian as needed. Handouts: Heart Healthy Nutrition Therapy (Spanish)     Psychosocial  Assessment: Positive coping mechanisms, Adequate support system  Barriers to Learning: Language  Barriers to Attendance: None  Occupation: works in [employer]  Work Status: Returned to work  Initial PHQ-9 Score: 3  Repeat PHQ9 per clinician?: No  Level of Depression Severity PHQ-9: None-Minimal 0-4  Initial COOP Quality of Life Survey Score: 10  Education/Intervention: Regular physical activity/exercise, Assist patient with identifying stressors, Educate on positive coping mechanisms  Goals: Adequate support system, Positive coping mechanisms  Goal(s) Status: Goal partially met  Comment: no concerns, patient comes from work     Blood Pressure  Assessment: No Dx HTN  Resting BP Range: 112-130/68-78  Education/Intervention: Recommend home BP checks, Educate/Reinforce benefits of regular exercise on BP, Educate/Reinforce home BP measurement techniques  Goals: BP<120/80 or within MD recommended guidelines, Medication compliance, Monitor BP at home  Goal(s) Status: Goal partially met  Comment: has BP monitor at home but does not feel that it's accurate. encouraged to bring monitor in to  check     Heart Failure:  Assessment: No Heart Failure    SET-PAD:  Assessment: No SET PAD    Patient Stated Goals  What Matters Most?: Tell the truth and will power to do it (11/29/2022  4:00 PM)      Patient Stated Goal 1: Lose weight  Goal 1 Status: Goal not met  Comment: pts weight has been consistent- no weight loss  Patient Stated Goal 2: Complete rehab   Goal 2 Status: Goal partially met  Comment: working towards this goal each day

## 2022-12-01 ENCOUNTER — Inpatient Hospital Stay
Admission: RE | Admit: 2022-12-01 | Discharge: 2022-12-01 | Disposition: A | Discharge status: Home / Self Care | Payer: HMO | Source: Ambulatory Visit

## 2022-12-01 VITALS — Wt 191.4 lb

## 2022-12-01 DIAGNOSIS — I214 Non-ST elevation (NSTEMI) myocardial infarction: Secondary | ICD-10-CM

## 2022-12-02 ENCOUNTER — Inpatient Hospital Stay
Admission: RE | Admit: 2022-12-02 | Discharge: 2022-12-02 | Disposition: A | Discharge status: Home / Self Care | Payer: HMO | Source: Ambulatory Visit

## 2022-12-02 DIAGNOSIS — I214 Non-ST elevation (NSTEMI) myocardial infarction: Secondary | ICD-10-CM

## 2022-12-06 ENCOUNTER — Inpatient Hospital Stay
Admission: RE | Admit: 2022-12-06 | Discharge: 2022-12-06 | Disposition: A | Discharge status: Home / Self Care | Payer: HMO | Source: Ambulatory Visit

## 2022-12-06 VITALS — Wt 189.2 lb

## 2022-12-06 DIAGNOSIS — I214 Non-ST elevation (NSTEMI) myocardial infarction: Secondary | ICD-10-CM

## 2022-12-08 ENCOUNTER — Inpatient Hospital Stay
Admission: RE | Admit: 2022-12-08 | Discharge: 2022-12-08 | Disposition: A | Discharge status: Home / Self Care | Payer: HMO | Source: Ambulatory Visit

## 2022-12-08 VITALS — Wt 191.0 lb

## 2022-12-08 DIAGNOSIS — I214 Non-ST elevation (NSTEMI) myocardial infarction: Secondary | ICD-10-CM

## 2022-12-09 ENCOUNTER — Inpatient Hospital Stay
Admission: RE | Admit: 2022-12-09 | Discharge: 2022-12-09 | Disposition: A | Discharge status: Home / Self Care | Payer: HMO | Source: Ambulatory Visit

## 2022-12-09 DIAGNOSIS — I214 Non-ST elevation (NSTEMI) myocardial infarction: Secondary | ICD-10-CM

## 2022-12-13 ENCOUNTER — Inpatient Hospital Stay
Admission: RE | Admit: 2022-12-13 | Discharge: 2022-12-13 | Disposition: A | Discharge status: Home / Self Care | Payer: HMO | Source: Ambulatory Visit

## 2022-12-13 VITALS — Wt 191.0 lb

## 2022-12-13 DIAGNOSIS — I214 Non-ST elevation (NSTEMI) myocardial infarction: Secondary | ICD-10-CM

## 2022-12-13 NOTE — Addendum Note (Signed)
Encounter addended by: Solmon Ice on: 12/13/2022 5:50 PM   Actions taken: Flowsheet accepted

## 2022-12-14 ENCOUNTER — Ambulatory Visit (INDEPENDENT_AMBULATORY_CARE_PROVIDER_SITE_OTHER): Payer: HMO

## 2022-12-14 DIAGNOSIS — R0989 Other specified symptoms and signs involving the circulatory and respiratory systems: Secondary | ICD-10-CM

## 2022-12-15 ENCOUNTER — Encounter (INDEPENDENT_AMBULATORY_CARE_PROVIDER_SITE_OTHER): Payer: Self-pay | Admitting: Cardiovascular Disease

## 2022-12-15 ENCOUNTER — Inpatient Hospital Stay
Admission: RE | Admit: 2022-12-15 | Discharge: 2022-12-15 | Disposition: A | Discharge status: Home / Self Care | Payer: HMO | Source: Ambulatory Visit

## 2022-12-15 DIAGNOSIS — I214 Non-ST elevation (NSTEMI) myocardial infarction: Secondary | ICD-10-CM

## 2022-12-16 ENCOUNTER — Other Ambulatory Visit (INDEPENDENT_AMBULATORY_CARE_PROVIDER_SITE_OTHER): Payer: HMO

## 2022-12-16 ENCOUNTER — Inpatient Hospital Stay
Admission: RE | Admit: 2022-12-16 | Discharge: 2022-12-16 | Disposition: A | Discharge status: Home / Self Care | Payer: HMO | Source: Ambulatory Visit

## 2022-12-16 DIAGNOSIS — I214 Non-ST elevation (NSTEMI) myocardial infarction: Secondary | ICD-10-CM

## 2022-12-20 ENCOUNTER — Inpatient Hospital Stay
Admission: RE | Admit: 2022-12-20 | Discharge: 2022-12-20 | Disposition: A | Discharge status: Home / Self Care | Payer: HMO | Source: Ambulatory Visit

## 2022-12-22 ENCOUNTER — Inpatient Hospital Stay
Admission: RE | Admit: 2022-12-22 | Discharge: 2022-12-22 | Disposition: A | Discharge status: Home / Self Care | Payer: No Typology Code available for payment source | Source: Ambulatory Visit | Attending: Cardiology | Admitting: Cardiology

## 2022-12-22 VITALS — Wt 189.0 lb

## 2022-12-22 DIAGNOSIS — I214 Non-ST elevation (NSTEMI) myocardial infarction: Secondary | ICD-10-CM | POA: Insufficient documentation

## 2022-12-23 ENCOUNTER — Inpatient Hospital Stay
Admission: RE | Admit: 2022-12-23 | Discharge: 2022-12-23 | Disposition: A | Discharge status: Home / Self Care | Payer: No Typology Code available for payment source | Source: Ambulatory Visit

## 2022-12-23 DIAGNOSIS — I214 Non-ST elevation (NSTEMI) myocardial infarction: Secondary | ICD-10-CM

## 2022-12-23 NOTE — Addendum Note (Signed)
Encounter addended by: Solmon Ice on: 12/23/2022 4:18 PM   Actions taken: Flowsheet accepted

## 2022-12-23 NOTE — Cardiac Rehab ITP (Signed)
Cardiac Rehab Individual Treatment Plan    Referring Provider: Verner Mould, MD   Comments: cardiology decreased lipitor to 20 mg. patient has been attending the program regularly    Assessment Period  Phase: 30 Day Re-assessment  Program : Currently enrolled in Phase 2  Session #: 22  First Exercise Session (Date): 11/02/22  Referring Diagnosis: NSTEMI, PCI, Stent (stent RCA)  Date of Event: 09/15/22  Additional Cardiac History: None  AACVPR Risk Stratification: Low risk  Ejection Fraction: 60-65%  Ejection Fraction (Test): Echo  Ejection Fraction Study Date: 08/2022  Comment: cardiology decreased lipitor to 20 mg. patient has been attending the program regularly    Exercise  Assessment: Exercise history 2 weeks pre cardiac rehab/post cardiac event  Physical Limitations: No  Current Exercise: Yes  Where: Cardiac Rehab (track)  On average, how many days per week do you engage in moderate to strenuous exercise (like a brisk walk)?: 5 days  On average, how many minutes do you engage in exercise at this level?: 40 min  Strength Training: Yes  Interval Training: Yes  Session #3 MET Level: 5.4  Current Peak MET Level: 7  Peak MET Goal: 7.56  MET Level Percentage Increase (%): 29.63  Adherence: Adheres to current cardiac rehab exercise prescription  Comment: continuing to work intervals between the bike and TM- pt is tolerating well.     Exercise Prescription/Plan  40% Heart Rate Reserve: 118  80% Heart Rate Reserve: 145  Frequency: Cardiac Rehab 3 days/week, Home 3 days/week  RPE Range: 12-16  METS: 7.0-7.5  Time: 50+ minutes  Mode: Treadmill, Bike, Rower  Strength Training: 2-3 days per week, 10-15 reps in 1-3 sets to moderate fatigue, Free weights, Wall pulley, Chair exercise  Education/Intervention: Self monitoring using pulse taking, heart rate monitor, activity tracker, Progress time and intensity when a steady state of HR and RPE occur, Develop a home exercise plan  Goals: Increase in METS by at least 40%,  Cardiovascular exercise 150-300 minutes per week, Avoid inactivity  Goal(s) Status: Goal partially met  Comment: planning to increase patients intensity on the upright bike. goal is to return to running     Nutrition  Assessment: Completed  Height: 182.9 cm (6')  Initial Weight (lbs): 193.2  Weight (lbs): 189  BMI (calculated): 25.6  BMI Classification: Overweight BMI 25-29.9  Waist Circumference (inches): 40.25  Initial Nutrition Assessment Survey Score : 15  Initial Nutrition Assessment Survey Score Indicates: 14-20: Demonstrates nutrition strategies to support reduction in CV risk, may meet with RDN for MNT reinforcement     Nutrition Plan  Current Eating Plan: Heart healthy  Food Log: Declined (gave 24-hr recall)  Hydration: Water  Fluid Restriction: No fluid restriction  Dietitian Consult Date: 11/18/22  Consult Status: Completed  CRP Education/Intervention: Educate on the health benefits of weight management, Reinforce RDN recommendations  Goals: BMI 18.5-24.9, Waist (men) <40 inches, (women) <35 inches, Heart healthy eating, Decrease body weight  Goal(s) Status: Goal partially met  Comment: patient continues with his heart healthy diet of cutting out red/processed meats and sodium. patient has increased his intake of healthy fish.  Nutrition Assessment Indicates: the following diagnoses:  Diagnosis 1 - Intake: Adequate energy intake  Diagnosis 3 - Behavioral: Food and nutrition-related knowledge deficit  Nutrition Intervention: Continue with current plan  Nutrition Monitoring /Evaluation: Follow up with RDN as needed; RDN contact info provided  Goal 1: Continue heart healthy diet  Goal 1 Status: Goal partially met (see comments)  Comment:  Action plan: Continue to limit foods that are high in saturated fat such as fried foods, butter, cream, coconut oil, coconut milk, ice cream, chips, chicken with skin, lamb, and high-fat cuts of meat such as ribs, bacon, sausage, hot dogs, salami, brisket, ribeye, and [place] strip. Choose a variety of heart-healthy proteins such as seafood, skinless poultry, eggs (2-4 per week), beans, peas, lentils, nuts, seeds, tofu, low fat dairy products, and lean cuts of meat such as sirloin, tenderloin, and 90% lean ground meat or greater. Choose foods that are grilled, baked, broiled, roasted, braised, steamed, or poached instead of deep-fried. Eat more unsaturated and omega-3 fats from salmon, tuna, cod, trout, nuts, seeds (like chia and flax), nut butters, avocados, and liquid vegetable oils such as olive or canola oil. Continue to emphasize complex carbohydrates including oats, barley, quinoa, brown or wild rice, 100% whole wheat bread, whole wheat pasta, whole fruit, and starchy vegetables instead of white bread, white rice, and sugary foods and beverages. Increase fiber intake from vegetables, fruits, whole grains, beans, nuts, and seeds. Follow up with dietitian as needed. Handouts: Heart Healthy Nutrition Therapy (Spanish)     Psychosocial  Assessment: Positive coping mechanisms, Adequate support system  Barriers to Learning: Language  Barriers to Attendance: None  Occupation: works in [employer]  Work Status: Returned to work  Initial PHQ-9 Score: 3  Repeat PHQ9 per clinician?: No  Level of Depression Severity PHQ-9: None-Minimal 0-4  Initial COOP Quality of Life Survey Score: 10  Education/Intervention: Regular physical activity/exercise, Assist patient with identifying stressors, Educate on positive coping mechanisms  Goals: Adequate support system, Positive coping mechanisms  Goal(s) Status: Goal partially met  Comment: pt reports his work is not stressful. a way he relieves stress is by meditating or going for a walk     Blood Pressure  Assessment: No Dx HTN  Resting BP Range: 112-125/68-78  Education/Intervention: Review/Educate on healthy sleep habits, Reinforce nutrition recommendations  Goals: BP<120/80 or within MD recommended guidelines, Medication compliance, Monitor BP at  home  Goal(s) Status: Goal partially met  Comment: normal cardiovascular response to exercise     Heart Failure:  Assessment: No Heart Failure    SET-PAD:  Assessment: No SET PAD    Patient Stated Goals  What Matters Most?: Tell the truth and will power to do it (12/23/2022  4:00 PM)      Patient Stated Goal 1: Lose weight  Goal 1 Status: Goal partially met  Comment: pt has lost 3lbs  Patient Stated Goal 2: Complete rehab   Goal 2 Status: Goal partially met  Comment: working towards this goal- patient has 3-4 weeks left

## 2022-12-27 ENCOUNTER — Inpatient Hospital Stay
Admission: RE | Admit: 2022-12-27 | Discharge: 2022-12-27 | Disposition: A | Discharge status: Home / Self Care | Payer: No Typology Code available for payment source | Source: Ambulatory Visit

## 2022-12-27 VITALS — Wt 190.0 lb

## 2022-12-27 DIAGNOSIS — I214 Non-ST elevation (NSTEMI) myocardial infarction: Secondary | ICD-10-CM

## 2022-12-29 ENCOUNTER — Inpatient Hospital Stay
Admission: RE | Admit: 2022-12-29 | Discharge: 2022-12-29 | Disposition: A | Discharge status: Home / Self Care | Payer: No Typology Code available for payment source | Source: Ambulatory Visit

## 2022-12-29 DIAGNOSIS — I214 Non-ST elevation (NSTEMI) myocardial infarction: Secondary | ICD-10-CM

## 2022-12-30 ENCOUNTER — Inpatient Hospital Stay
Admission: RE | Admit: 2022-12-30 | Discharge: 2022-12-30 | Disposition: A | Discharge status: Home / Self Care | Payer: No Typology Code available for payment source | Source: Ambulatory Visit

## 2022-12-30 DIAGNOSIS — I214 Non-ST elevation (NSTEMI) myocardial infarction: Secondary | ICD-10-CM

## 2022-12-30 NOTE — Addendum Note (Signed)
Encounter addended by: Solmon Ice on: 12/30/2022 4:25 PM   Actions taken: Flowsheet accepted

## 2023-01-03 ENCOUNTER — Inpatient Hospital Stay
Admission: RE | Admit: 2023-01-03 | Discharge: 2023-01-03 | Disposition: A | Discharge status: Home / Self Care | Payer: No Typology Code available for payment source | Source: Ambulatory Visit

## 2023-01-03 DIAGNOSIS — I214 Non-ST elevation (NSTEMI) myocardial infarction: Secondary | ICD-10-CM

## 2023-01-03 NOTE — Addendum Note (Signed)
Encounter addended by: Solmon Ice on: 01/03/2023 5:18 PM   Actions taken: Flowsheet accepted

## 2023-01-05 ENCOUNTER — Inpatient Hospital Stay
Admission: RE | Admit: 2023-01-05 | Discharge: 2023-01-05 | Disposition: A | Discharge status: Home / Self Care | Payer: No Typology Code available for payment source | Source: Ambulatory Visit

## 2023-01-05 DIAGNOSIS — I214 Non-ST elevation (NSTEMI) myocardial infarction: Secondary | ICD-10-CM

## 2023-01-06 ENCOUNTER — Inpatient Hospital Stay
Admission: RE | Admit: 2023-01-06 | Discharge: 2023-01-06 | Disposition: A | Discharge status: Home / Self Care | Payer: No Typology Code available for payment source | Source: Ambulatory Visit

## 2023-01-06 DIAGNOSIS — I214 Non-ST elevation (NSTEMI) myocardial infarction: Secondary | ICD-10-CM

## 2023-01-10 ENCOUNTER — Inpatient Hospital Stay
Admission: RE | Admit: 2023-01-10 | Discharge: 2023-01-10 | Disposition: A | Discharge status: Home / Self Care | Payer: No Typology Code available for payment source | Source: Ambulatory Visit

## 2023-01-10 DIAGNOSIS — I214 Non-ST elevation (NSTEMI) myocardial infarction: Secondary | ICD-10-CM

## 2023-01-10 NOTE — Cardiac Rehab ITP (Signed)
Cardiac Rehab Individual Treatment Plan    Referring Provider: Verner Mould, MD   Comments: patient graduated after completing 29 sessions. no cardiac symptoms with exercise reported.    Assessment Period  Phase: Discharge Assessment  Program : Program Complete  Session #: 72  First Exercise Session (Date): 11/02/22  Referring Diagnosis: NSTEMI, PCI, Stent (stent RCA)  Date of Event: 09/15/22  Additional Cardiac History: None  AACVPR Risk Stratification: Low risk  Ejection Fraction: 60-65%  Ejection Fraction (Test): Echo  Ejection Fraction Study Date: 08/2022  Comment: patient graduated after completing 29 sessions. no cardiac symptoms with exercise reported.    Handout Provided:  (Medications)     Exercise  Assessment: Exercise history 2 weeks pre cardiac rehab/post cardiac event  Physical Limitations: No  Current Exercise: Yes  Where: Cardiac Rehab, [facility] member (track)  On average, how many days per week do you engage in moderate to strenuous exercise (like a brisk walk)?: 5 days  On average, how many minutes do you engage in exercise at this level?: 50 min  Strength Training: Yes  Interval Training: Yes  Session #3 MET Level: 5.4  Current Peak MET Level: 7.6  Peak MET Goal: 7.56  MET Level Percentage Increase (%): 40.74  Adherence: Adheres to current cardiac rehab exercise prescription  Comment: increased TM intervals today     Exercise Prescription/Plan  40% Heart Rate Reserve: 118  80% Heart Rate Reserve: 145  Frequency: Home 5 days/week  RPE Range: 12-16  METS: 7.0+  Time: 50+ minutes  Mode: Treadmill, Bike, Rower  Strength Training: 2-3 days per week, 10-15 reps in 1-3 sets to moderate fatigue, Free weights, Wall pulley, Chair exercise  Education/Intervention: Education & Intervention complete / Continue to reinforce previous education  Goals: Increase in METS by at least 40%, Cardiovascular exercise 150-300 minutes per week, Avoid inactivity  Goal(s) Status: Goal met  Comment: patient is planning to attend  the gym at least 5 days a week. Discussed the goal is 150 mins of exercise and ST muscles on nonconsecutive days.     Nutrition  Assessment: Completed  Height: 182.9 cm (6')  Initial Weight (lbs): 193.2  Weight (lbs): 189  BMI (calculated): 25.6  BMI Classification: Overweight BMI 25-29.9  Waist Circumference (inches): 40.25  Initial Nutrition Assessment Survey Score : 15  Initial Nutrition Assessment Survey Score Indicates: 14-20: Demonstrates nutrition strategies to support reduction in CV risk, may meet with RDN for MNT reinforcement  Discharge Nutrition Assessment Survey Score : 13  Discharge Nutrition Assessment Survey Score Indicates: 7-13: Demonstrates some nutrition strategies to support reduction in CV risk, meet with RDN for MNT     Nutrition Plan  Current Eating Plan: Heart healthy  Food Log: Declined (gave 24-hr recall)  Hydration: Water  Fluid Restriction: No fluid restriction  Dietitian Consult Date: 11/18/22  Consult Status: Completed  CRP Education/Intervention: Education & Intervention complete / Continue to reinforce previous education  Goals: BMI 18.5-24.9, Waist (men) <40 inches, (women) <35 inches, Heart healthy eating, Decrease body weight  Goal(s) Status: Goal partially met  Comment: patient continues with his heart healthy diet of cutting out red/processed meats and sodium. patient has increased his intake of healthy fish.  Nutrition Assessment Indicates: the following diagnoses:  Diagnosis 1 - Intake: Adequate energy intake  Diagnosis 3 - Behavioral: Food and nutrition-related knowledge deficit  Nutrition Intervention: Continue with current plan  Nutrition Monitoring /Evaluation: Follow up with RDN as needed; RDN contact info provided  Goal 1: Continue  heart healthy diet  Goal 1 Status: Goal partially met (see comments)  Comment: Action plan: Continue to limit foods that are high in saturated fat such as fried foods, butter, cream, coconut oil, coconut milk, ice cream, chips, chicken with  skin, lamb, and high-fat cuts of meat such as ribs, bacon, sausage, hot dogs, salami, brisket, ribeye, and [place] strip. Choose a variety of heart-healthy proteins such as seafood, skinless poultry, eggs (2-4 per week), beans, peas, lentils, nuts, seeds, tofu, low fat dairy products, and lean cuts of meat such as sirloin, tenderloin, and 90% lean ground meat or greater. Choose foods that are grilled, baked, broiled, roasted, braised, steamed, or poached instead of deep-fried. Eat more unsaturated and omega-3 fats from salmon, tuna, cod, trout, nuts, seeds (like chia and flax), nut butters, avocados, and liquid vegetable oils such as olive or canola oil. Continue to emphasize complex carbohydrates including oats, barley, quinoa, brown or wild rice, 100% whole wheat bread, whole wheat pasta, whole fruit, and starchy vegetables instead of white bread, white rice, and sugary foods and beverages. Increase fiber intake from vegetables, fruits, whole grains, beans, nuts, and seeds. Follow up with dietitian as needed. Handouts: Heart Healthy Nutrition Therapy (Spanish)     Psychosocial  Assessment: Positive coping mechanisms, Adequate support system  Barriers to Learning: Language  Barriers to Attendance: None  Occupation: works in [employer]  Work Status: Returned to work  Initial PHQ-9 Score: 3  Repeat PHQ9 per clinician?: No  Discharge PHQ-9 Score: 0  Level of Depression Severity PHQ-9: None-Minimal 0-4  Initial COOP Quality of Life Survey Score: 10  Discharge COOP Quality of Life Survey Score: 14  Education/Intervention: Education & Intervention complete / Continue to reinforce previous education  Goals: Adequate support system, Positive coping mechanisms  Goal(s) Status: Goal met  Comment: pt reports his work is not stressful. a way he relieves stress is by meditating or going for a walk. PHQ-9 improved since starting the program. Patient reports feeling better     Blood Pressure  Assessment: No Dx HTN  Resting BP Range:  108-125/68-78  Education/Intervention: Education & Intervention complete / Continue to reinforce previous education  Goals: BP<120/80 or within MD recommended guidelines, Medication compliance, Monitor BP at home  Goal(s) Status: Goal met  Comment: Patients HR response after exercise improved greatly- returned to baseline quickly. BP is normal and patient reports feeling good.     Heart Failure:  Assessment: No Heart Failure    SET-PAD:  Assessment: No SET PAD    Patient Stated Goals  What Matters Most?: Tell the truth and will power to do it (01/10/2023  5:00 PM)      Patient Stated Goal 1: Lose weight  Goal 1 Status: Goal met  Comment: pt has lost 3lbs- has focused more on nutrition goals  Patient Stated Goal 2: Complete rehab   Goal 2 Status: Goal met  Comment: Patient did a wonderful job at progressing and exercising without cardiac symptoms. Patient plans to continue at the gym.

## 2023-01-12 ENCOUNTER — Ambulatory Visit: Payer: No Typology Code available for payment source

## 2023-01-13 ENCOUNTER — Ambulatory Visit: Payer: No Typology Code available for payment source

## 2023-01-24 ENCOUNTER — Ambulatory Visit (INDEPENDENT_AMBULATORY_CARE_PROVIDER_SITE_OTHER): Payer: No Typology Code available for payment source | Admitting: Nurse Practitioner

## 2023-01-24 ENCOUNTER — Encounter (INDEPENDENT_AMBULATORY_CARE_PROVIDER_SITE_OTHER): Payer: Self-pay | Admitting: Nurse Practitioner

## 2023-01-24 VITALS — BP 110/70 | HR 68 | Ht 72.0 in | Wt 184.8 lb

## 2023-01-24 DIAGNOSIS — I2510 Atherosclerotic heart disease of native coronary artery without angina pectoris: Secondary | ICD-10-CM

## 2023-01-24 DIAGNOSIS — E7800 Pure hypercholesterolemia, unspecified: Secondary | ICD-10-CM

## 2023-01-24 DIAGNOSIS — I214 Non-ST elevation (NSTEMI) myocardial infarction: Secondary | ICD-10-CM

## 2023-01-24 MED ORDER — ATORVASTATIN CALCIUM 20 MG PO TABS
20.0000 mg | ORAL_TABLET | Freq: Every day | ORAL | 3 refills | Status: AC
Start: 2023-01-24 — End: ?

## 2023-01-24 MED ORDER — METOPROLOL SUCCINATE ER 25 MG PO TB24
25.0000 mg | ORAL_TABLET | Freq: Every evening | ORAL | 3 refills | Status: AC
Start: 2023-01-24 — End: ?

## 2023-01-24 NOTE — Progress Notes (Signed)
[facility] C[facility] OFF[facility] PROGRESS NOTE    [facility] OFFICE - CARDIOLOGY  [address]  [place] [address]  Dept: [phone]  Dept Fax: [phone]       Patient Name: Alexander Russell    Date of Visit:  January 24, 2023  Date of Birth: 07-01-1961  AGE: 62 y.o.  Medical Record #: [id number]  Requesting Physician: PCP None, MD      CHIEF COMPLAINT: Follow up: NSTEMI (non-ST elevated myocardial infarction)      HISTORY OF PRESENT ILLNESS:    He is a pleasant 62 y.o. male who presents today for coronary artery disease.  He is doing very well keeping his weight down eating healthy and exercising 4 days a week.  His last lipids were well-controlled.  He has no chest pain or shortness of breath.      PAST MEDICAL HISTORY: He has a past medical history of Coronary artery disease and Hypertension. He has a past surgical history that includes Cardiac catheterization and Coronary angioplasty with stent.    ALLERGIES: No Known Allergies    MEDICATIONS:   Patient's current medications were reviewed. ONLY Cardiac medications were updated unless others were addressed in assessment and plan.    Current Outpatient Medications:     Aspirin Low Dose 81 MG EC tablet, Take 1 tablet (81 mg) by mouth every morning, Disp: , Rfl:     atorvastatin (LIPITOR) 20 MG tablet, Take 1 tablet (20 mg) by mouth daily, Disp: , Rfl:     clopidogrel (PLAVIX) 75 mg tablet, Take 1 tablet (75 mg) by mouth every morning, Disp: , Rfl:     metoprolol tartrate (LOPRESSOR) 25 MG tablet, Take 1 tablet (25 mg) by mouth 2 (two) times daily, Disp: , Rfl:      FAMILY HISTORY: family history includes Hypertension in his brother.    SOCIAL HISTORY: He reports that he has never smoked. He has never used smokeless tobacco. He reports that he does not drink alcohol and does not use drugs.    PHYSICAL EXAMINATION    Visit Vitals  BP 110/70 (BP Site: Left arm, Patient Position: Sitting, Cuff Size:  Medium)   Pulse 68   Ht 1.829 m (6')   Wt 83.8 kg (184 lb 12.8 oz)   BMI 25.06 kg/m       Constitutional: Cooperative, alert, no acute distress.  Neck: No carotid bruits, JVP normal.  Cardiac: Regular rate and rhythm, normal S1 and S2; no S3 or S4, no murmurs, no rubs, no gallops.  Pulmonary: Clear to auscultation bilaterally, no wheezing, no rhonchi, no rales.  Extremities: no edema.  Vascular: +2 pulses in radial artery bilaterally, 2+ pedal pulses bilaterally.      ECG: N/A      LABS:     Lab Results   Component Value Date    CHOL 95 (L) 11/02/2022    TRIG 50 11/02/2022    HDL 37 (L) 11/02/2022    LDL 46 11/02/2022        Most recent echo and nuclear study reviewed.      IMPRESSION:   Alexander Russell is a 62 y.o. male with the following problems:    Coronary artery disease  NSTEMI 09/15/22 s/p thrombectomy/DES to thrombotic proximal ectatic RCA.  Minimal residual disease  Asymptomatic  Orthostasis resolved  Diminished right radial pulse  Hypertension  Hyperlipidemia 46  Elevated LP(a)  Echo Jan 2024: LVEF 60-65%,  mild LVH, G1DD, mild MAC, trace MR      RECOMMENDATIONS:    Change metoprolol to 50 mg once a day  Refilled atorvastatin 20 mg he has been cutting at 40 mg and half  Return office visit 1 year with Dr. Charm Barges which will be January  Continue his regular exercise which he is tolerating well                                                     No orders of the defined types were placed in this encounter.      No orders of the defined types were placed in this encounter.      SIGNED:    Ernestina Penna, NP          This note was generated by the Dragon speech recognition and may contain errors or omissions not intended by the user. Grammatical errors, random word insertions, deletions, pronoun errors, and incomplete sentences are occasional consequences of this technology due to software limitations. Not all errors are caught or corrected. If there are questions or concerns about the content of this note or  information contained within the body of this dictation, they should be addressed directly with the author for clarification.

## 2023-07-29 ENCOUNTER — Ambulatory Visit (INDEPENDENT_AMBULATORY_CARE_PROVIDER_SITE_OTHER): Payer: No Typology Code available for payment source | Admitting: Cardiology

## 2023-09-28 ENCOUNTER — Ambulatory Visit (INDEPENDENT_AMBULATORY_CARE_PROVIDER_SITE_OTHER): Payer: HMO | Admitting: Student

## 2023-09-28 ENCOUNTER — Encounter (INDEPENDENT_AMBULATORY_CARE_PROVIDER_SITE_OTHER): Payer: Self-pay | Admitting: Student

## 2023-09-28 VITALS — BP 132/92 | HR 67 | Ht 72.0 in | Wt 192.0 lb

## 2023-09-28 DIAGNOSIS — I2510 Atherosclerotic heart disease of native coronary artery without angina pectoris: Secondary | ICD-10-CM

## 2023-09-28 DIAGNOSIS — E7800 Pure hypercholesterolemia, unspecified: Secondary | ICD-10-CM

## 2023-09-28 DIAGNOSIS — R0609 Other forms of dyspnea: Secondary | ICD-10-CM

## 2023-09-28 LAB — ECG 12-LEAD
Atrial Rate: 67 {beats}/min
IHS MUSE NARRATIVE AND IMPRESSION: NORMAL
P Axis: 59 degrees
P-R Interval: 166 ms
Q-T Interval: 410 ms
QRS Duration: 90 ms
QTC Calculation (Bezet): 433 ms
R Axis: -62 degrees
T Axis: 7 degrees
Ventricular Rate: 67 {beats}/min

## 2023-09-28 NOTE — Progress Notes (Signed)
 [facility] C[facility] OFF[facility] PROGRESS NOTE    [facility] [place]  [place]  [facility] OFFICE - CARDIOLOGY  [address]  [place] [address]  Dept: [phone]  Dept Fax: [phone]       Patient Name: Alexander Russell    Date of Visit:  September 28, 2023  Date of Birth: May 18, 1961  AGE: 63 y.o.  Medical Record #: [id number]  Requesting Physician: Lory Bernida BIRCH, MD    CHIEF COMPLAINT: Coronary artery disease involving native coronary artery of       HISTORY OF PRESENT ILLNESS:    Alexander Russell is a pleasant 63 y.o. male who presents today for follow-up.  Interview was conducted through Spanish interpreter.    He has notable history of coronary artery disease after an NSTEMI in January 2024 resulting in drug-eluting stent to his RCA.    He has done well since last year.  He denies any complaints at this time and continues to work engineer, technical sales for a company.  He goes to the gym and exercises often as he can and is not limited by any symptoms.  He denies chest pain, palpitations or syncope.    He notes sometimes when he climbs stairs he does feel little bit dyspneic but this does not compare to what he felt in the past prior to his NSTEMI.    He has been compliant with his DAPT and his cholesterol-lowering medication.  His blood pressure at home is typically 120s to 130s systolic.    PAST MEDICAL HISTORY: He has a past medical history of Coronary artery disease and Hypertension. He has a past surgical history that includes Cardiac catheterization and Coronary angioplasty with stent.    ALLERGIES:  Allergies  Reviewed by Wilfred Raya, Sharyne, MA on 09/28/2023   No Known Allergies         MEDICATIONS:  Current Outpatient Medications   Medication Instructions    Aspirin Low Dose 81 mg, Every morning    atorvastatin  (LIPITOR) 20 mg, Oral, Daily    metoprolol  succinate XL (TOPROL -XL) 25 mg, Oral, Every evening    tamsulosin (FLOMAX) 0.4 mg, Daily after  dinner       SOCIAL HISTORY: He reports that he has never smoked. He has never used smokeless tobacco. He reports that he does not drink alcohol and does not use drugs.    PHYSICAL EXAMINATION    Visit Vitals  BP (!) 132/92 (BP Site: Left arm, Patient Position: Sitting, Cuff Size: Medium)   Pulse 67   Ht 1.829 m (6')   Wt 87.1 kg (192 lb)   BMI 26.04 kg/m       Constitutional: Cooperative, alert, no acute distress.  Neck: No carotid bruit, JVP normal.  Cardiac: Regular rate and rhythm, no murmur  Pulmonary: Clear to auscultation bilaterally  Extremities: no edema.  Vascular: +2 pulses in radial artery bilaterally    Labs: Relevant labs reviewed   No results found for: WBC, HGB, HCT, PLT  No results found for: GLU, BUN, CREAT, NA, K, CL, CO2, AST, ALT  No results found for: MG, TSH, HGBA1C, BNP  Lab Results   Component Value Date    CHOL 95 (L) 11/02/2022    TRIG 50 11/02/2022    HDL 37 (L) 11/02/2022    LDL 46 11/02/2022       ECG: Sinus, left anterior fascicular block    IMPRESSION:   Alexander Russell is a 62 y.o. male with  the following cardiology relevant diagnoses:    Coronary artery disease  NSTEMI 09/15/22 s/p thrombectomy/DES to thrombotic proximal ectatic RCA.  Minimal residual disease  Asymptomatic  Orthostasis resolved after stopping antihypertensives  Diminished right radial pulse  Right upper extremity ultrasound/2024: Normal with patent right radial artery noted  Hypertension  Hyperlipidemia  Lipid panel 10/2022 on atorvastatin  20: Total cholesterol 95, triglycerides 50, HDL 37, LDL 46  Lipid panel 02/2023 on atorvastatin  20: Total cholesterol 132, triglycerides 57, HDL 52, LDL 60  Elevated LP(a)  Echo Jan 2024: LVEF 60-65%, mild LVH, G1DD, mild MAC, trace MR    RECOMMENDATIONS:    For his dyspnea on exertion, this may just be advancing age/deconditioning.  However, I have ordered an echocardiogram for evaluation.  His symptoms are not exertional and thus we will hold off on any  stress testing at this time  For his coronary disease, I recommended he stop the Plavix now as it has been more than 12 months.  He will continue aspirin indefinitely.  He also continue Lipitor and metoprolol .  For his blood pressure, he is under adequate control at this time.  Follow up in 6-12 months.  Or sooner if any questions or concerns.                                               Orders Placed This Encounter   Procedures    ECG 12 lead (Normal)    Echo 2D Complete     No orders of the defined types were placed in this encounter.        SIGNED:        Emery Clamp, MD, [facility]  [place]  [facility]      This note was generated by the Dragon speech recognition and may contain errors or omissions not intended by the user. Grammatical errors, random word insertions, deletions, pronoun errors, and incomplete sentences are occasional consequences of this technology due to software limitations. Not all errors are caught or corrected. If there are questions or concerns about the content of this note or information contained within the body of this dictation, they should be addressed directly with the author for clarification.

## 2023-10-05 ENCOUNTER — Encounter (INDEPENDENT_AMBULATORY_CARE_PROVIDER_SITE_OTHER): Payer: Self-pay

## 2023-11-22 ENCOUNTER — Encounter (INDEPENDENT_AMBULATORY_CARE_PROVIDER_SITE_OTHER): Payer: HMO

## 2023-12-19 ENCOUNTER — Ambulatory Visit

## 2024-01-04 ENCOUNTER — Other Ambulatory Visit (INDEPENDENT_AMBULATORY_CARE_PROVIDER_SITE_OTHER): Payer: Self-pay | Admitting: Nurse Practitioner

## 2024-01-04 DIAGNOSIS — E7800 Pure hypercholesterolemia, unspecified: Secondary | ICD-10-CM

## 2024-01-12 ENCOUNTER — Other Ambulatory Visit (INDEPENDENT_AMBULATORY_CARE_PROVIDER_SITE_OTHER): Payer: Self-pay | Admitting: Nurse Practitioner

## 2024-01-12 DIAGNOSIS — I2510 Atherosclerotic heart disease of native coronary artery without angina pectoris: Secondary | ICD-10-CM

## 2024-01-17 MED ORDER — METOPROLOL SUCCINATE ER 25 MG PO TB24
25.0000 mg | ORAL_TABLET | Freq: Every evening | ORAL | 3 refills | Status: AC
Start: 2024-01-17 — End: ?

## 2024-07-29 IMAGING — MR COLUNA^LOMBAR
4 of 5 series · 18 of 48 positions shown · non-contrast
Comparison: none

[Series 2: sagital_t2_quiet · sagittal · 4.5mm · 0.55mm/px · 8 of 12 slices shown]
[im 1/12]
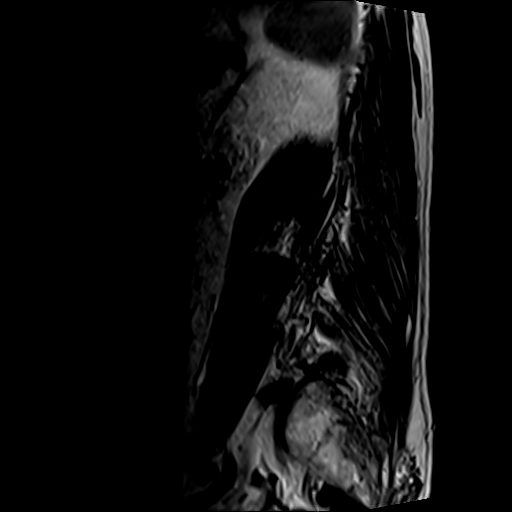
[im 2/12]
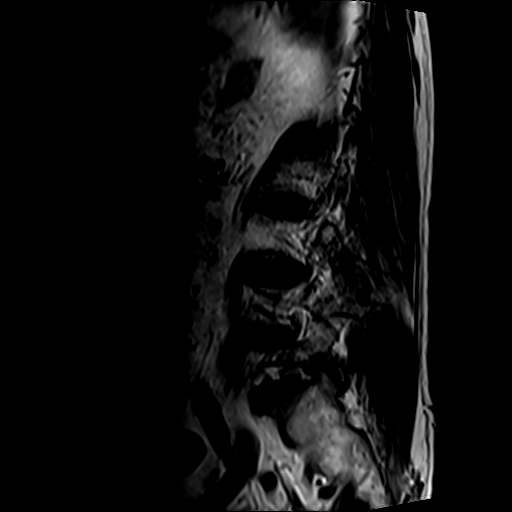
[im 4/12]
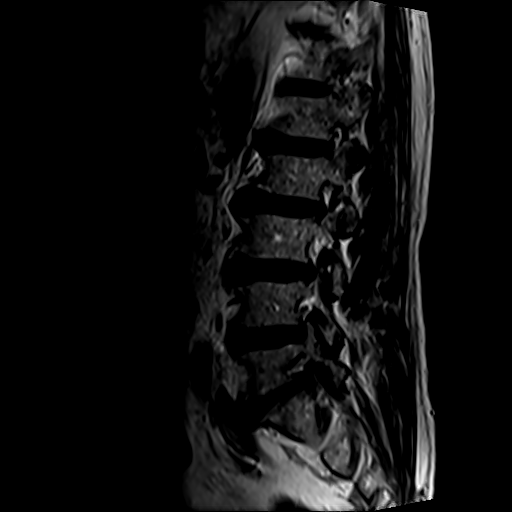
[im 5/12]
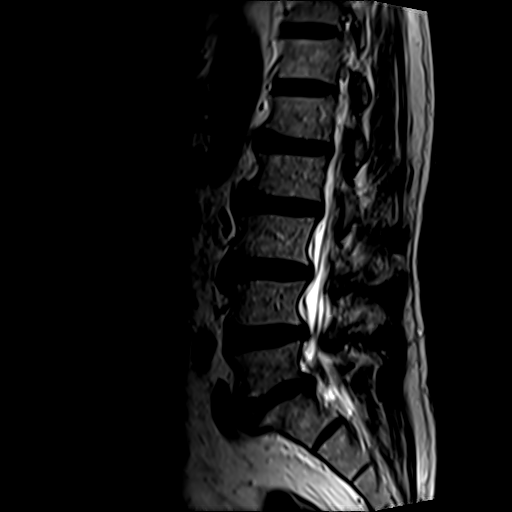
[im 7/12]
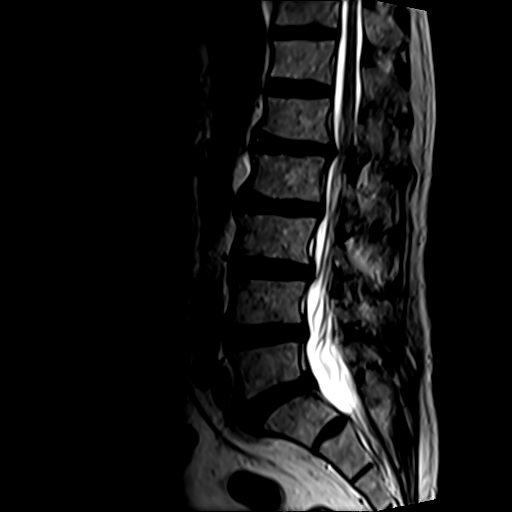
[im 8/12]
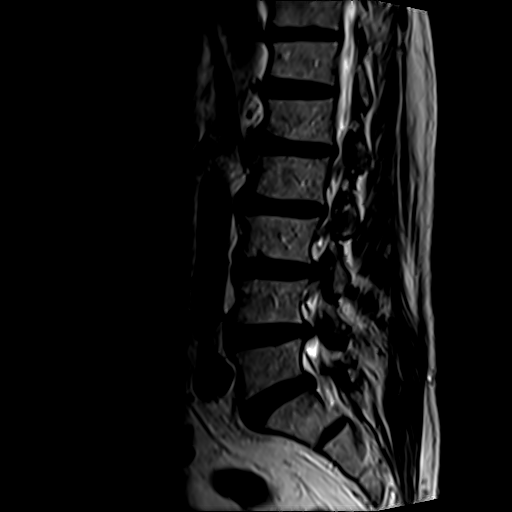
[im 10/12]
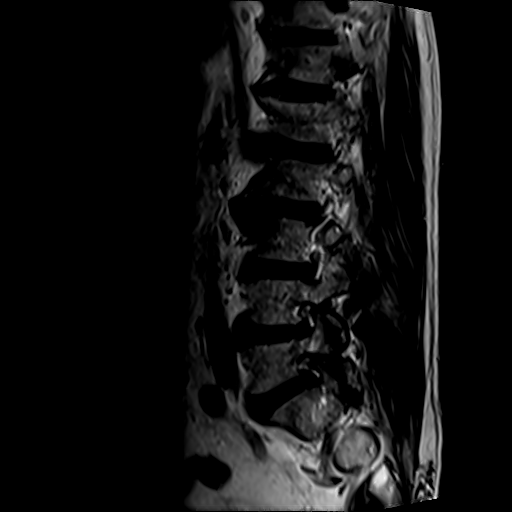
[im 12/12]
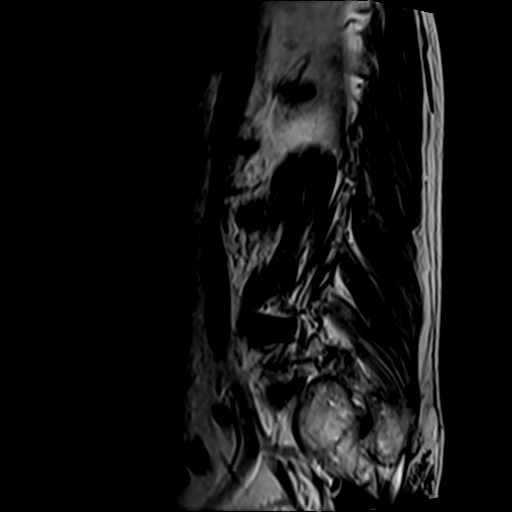

[Series 3: sagital_t1_quiet · sagittal · 4.5mm · 0.55mm/px · 4 of 12 slices shown]
[im 1/12]
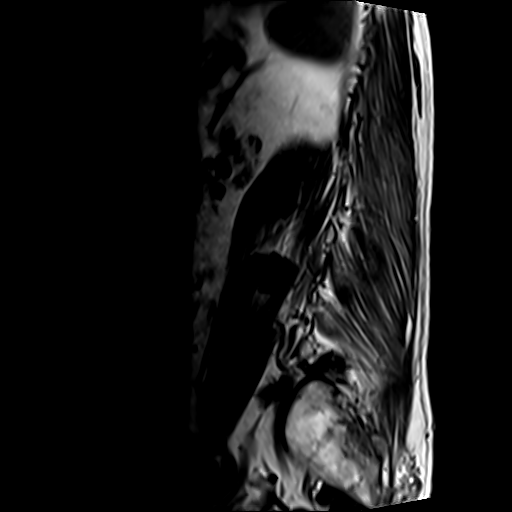
[im 2/12]
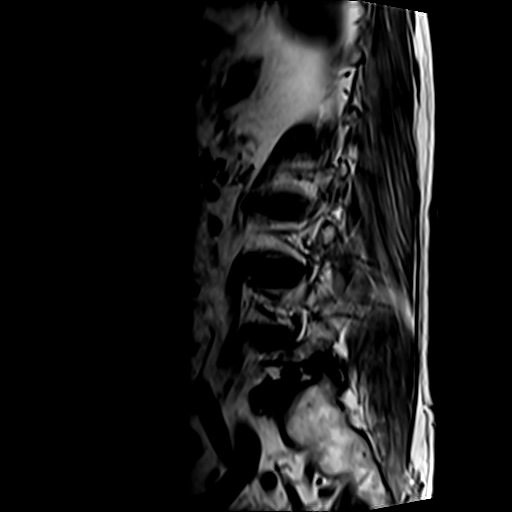
[im 6/12]
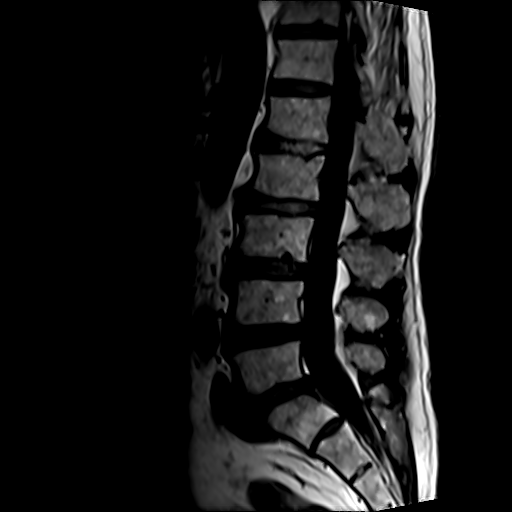
[im 10/12]
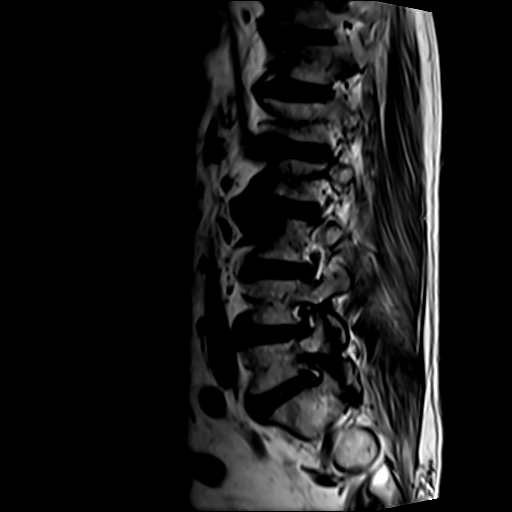

[Series 4: sag_stir_quiet · sagittal · 4.5mm · 0.55mm/px · 3 of 12 slices shown]
[im 2/12]
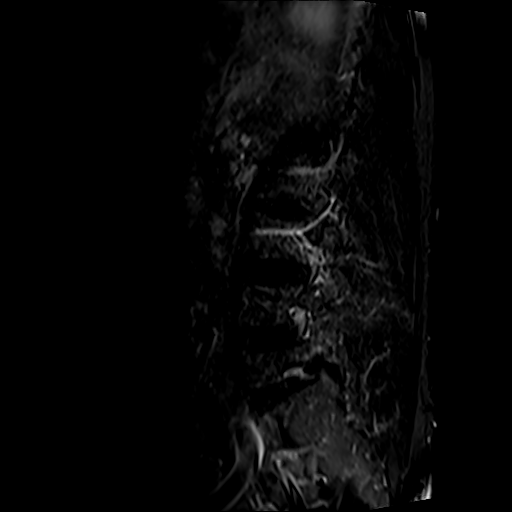
[im 6/12]
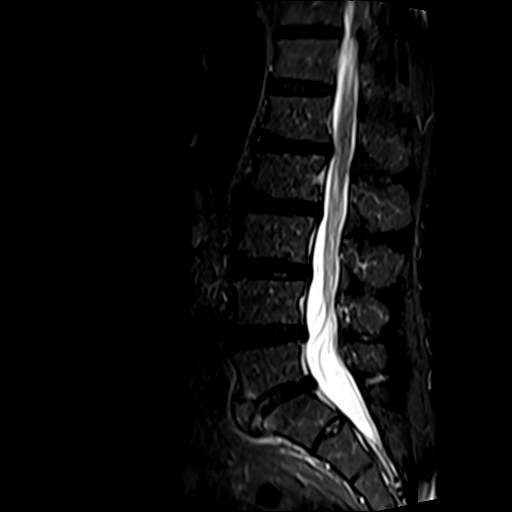
[im 10/12]
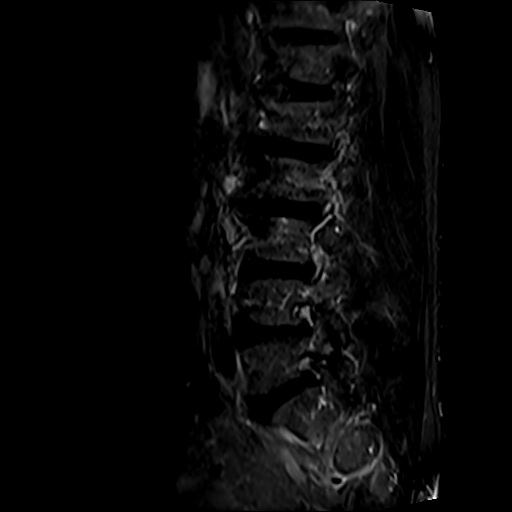

[Series 5: axial_t2_quiet · axial · 4.0mm · 0.43mm/px · z∈[+56,+132]mm · 3 of 20 slices shown]
[im 4/20]
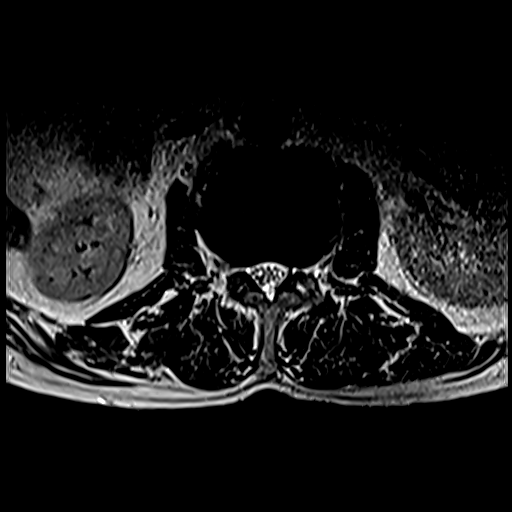
[im 11/20]
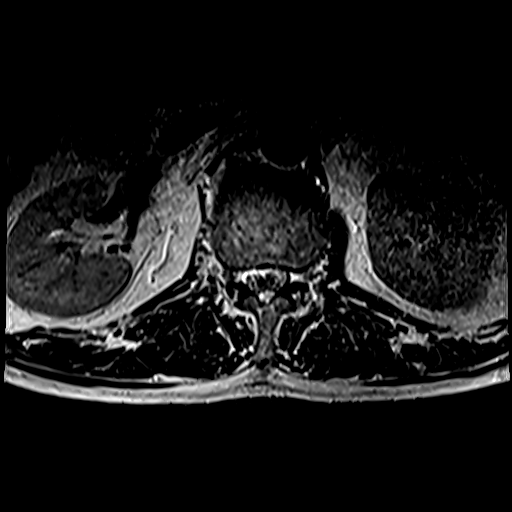
[im 18/20]
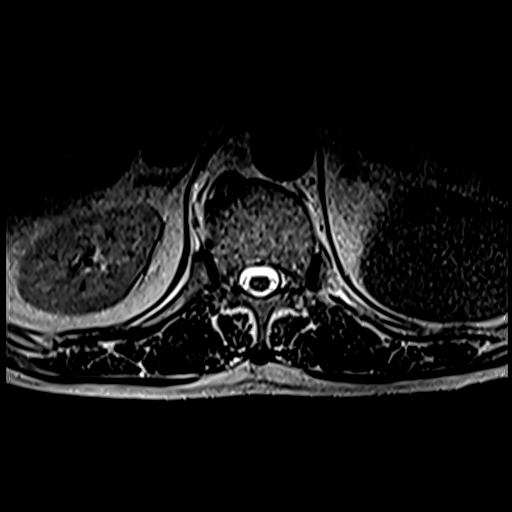

[18 of 48 positions shown; findings below may reference images not displayed]

Técnica: Realizamos sequências ponderadas em T1 e T2 nos planos sagital e axial.

Relatório:
Corpos  vertebrais  com  altura  e  alinhamento  posterior  preservados,  observando
[facility] COLUNA LOMBAR
osteófitos  marginais  e sindesmófitos,  bem  como  pequenas  áreas  de  substituição
adiposa nas suas margens anteriores, relacionado ao processo de espondilose. Nota-se
ainda,  leve  alteração  degenerativa  discogênica  de  Modic  tipo  I  nos  platôs  contíguos
de L5-S1.
Leve  redução  de  intensidade  de  sinal  em  T2  dos  discos  intervertebrais  lombares
e levemente da altura nos diversos níveis, por processo degenerativo.
Em  L1-L2: protrusão  discal  posterior  mediana  /  paramediana  direita,  que  apaga  a
gordura epidural anterior, comprimindo suavemente a margem anterior do saco dural.
Em  L2-L3:  abaulamento  discal  difuso,  que  apaga  a  gordura  epidural  anterior,
contactando  suavemente  a  margem  anterior  do  saco  dural  com  leve  projeção  nos
forames neurais.
Em  L3-L4:  abaulamento  discal  difuso,  que  apaga  a  gordura  epidural  anterior,
contactando suavemente a margem anterior do saco dural com projeção nos forames
neurais na proximidade das raízes emergentes correspondentes.
Em  L4-L5:  abaulamento  discal  difuso,  que  apaga  a  gordura  epidural  anterior,
comprimindo suavemente a margem anterior do saco dural com projeção nos forames
neurais na proximidade das raízes emergentes correspondentes.
Em  L5-S1:  abaulamento  discal  difuso,  que  apaga  a  gordura  epidural  anterior,
contactando  a  margem  anterior  do  saco  dural  com  projeção  nos  forames  neurais  e
contactando as raízes emergentes correspondentes, notadamente à esquerda.
Leve  hipersinal  em  T2  na  topografia  dos  ligamentos  interespinhosos  em  L4-L5
e notadamente em L5-S1, sugerindo leve injúria ligamentar / sobrecarga mecânica.
Sinais de artrose interfacetária em L4-L5 e L5-S1.
Demais elementos posteriores sem alterações.
Canal vertebral e demais forames neurais com dimensões preservadas.
[facility] medular com topografia, morfologia e características de sinal normais. Raízes da
cauda equina com distribuição habitual.

Musculatura paravertebral com trofismo preservado.

Impressão Diagnóstica:
Espondiloartrose lombar com protrusão discal posterior mediana / paramediana direita
em L1-L2 e abaulamentos discais difusos com projeção nos forames neurais de L2-L3
a L5-S1.
Sinais de leve injúria ligamentar / sobrecarga mecânica nos ligamentos interespinhosos
em L4-L5 e notadamente em L5-S1.

## 2024-08-08 ENCOUNTER — Other Ambulatory Visit (INDEPENDENT_AMBULATORY_CARE_PROVIDER_SITE_OTHER): Payer: Self-pay | Admitting: Family Medicine
